# Patient Record
Sex: Male | Born: 1935 | Race: White | Hispanic: No | Marital: Married | Smoking: Never smoker
Health system: Southern US, Community
[De-identification: ages and names within clinical notes are randomized; demographics above are authoritative.]

## PROBLEM LIST (undated history)

## (undated) DIAGNOSIS — D649 Anemia, unspecified: Secondary | ICD-10-CM

## (undated) DIAGNOSIS — C61 Malignant neoplasm of prostate: Secondary | ICD-10-CM

## (undated) DIAGNOSIS — R1310 Dysphagia, unspecified: Secondary | ICD-10-CM

## (undated) DIAGNOSIS — E871 Hypo-osmolality and hyponatremia: Secondary | ICD-10-CM

## (undated) DIAGNOSIS — E785 Hyperlipidemia, unspecified: Secondary | ICD-10-CM

## (undated) DIAGNOSIS — D126 Benign neoplasm of colon, unspecified: Secondary | ICD-10-CM

## (undated) DIAGNOSIS — L84 Corns and callosities: Secondary | ICD-10-CM

## (undated) DIAGNOSIS — R413 Other amnesia: Secondary | ICD-10-CM

## (undated) DIAGNOSIS — I4891 Unspecified atrial fibrillation: Secondary | ICD-10-CM

## (undated) DIAGNOSIS — R739 Hyperglycemia, unspecified: Secondary | ICD-10-CM

## (undated) DIAGNOSIS — M1990 Unspecified osteoarthritis, unspecified site: Secondary | ICD-10-CM

## (undated) DIAGNOSIS — M1652 Unilateral post-traumatic osteoarthritis, left hip: Secondary | ICD-10-CM

## (undated) DIAGNOSIS — I825Y9 Chronic embolism and thrombosis of unspecified deep veins of unspecified proximal lower extremity: Secondary | ICD-10-CM

## (undated) DIAGNOSIS — C9001 Multiple myeloma in remission: Secondary | ICD-10-CM

## (undated) DIAGNOSIS — I10 Essential (primary) hypertension: Secondary | ICD-10-CM

## (undated) DIAGNOSIS — Z950 Presence of cardiac pacemaker: Secondary | ICD-10-CM

## (undated) DIAGNOSIS — D72825 Bandemia: Secondary | ICD-10-CM

## (undated) DIAGNOSIS — Z7901 Long term (current) use of anticoagulants: Secondary | ICD-10-CM

## (undated) HISTORY — DX: Unspecified osteoarthritis, unspecified site: M19.90

## (undated) HISTORY — PX: JOINT REPLACEMENT: SHX530

## (undated) HISTORY — DX: Essential (primary) hypertension: I10

## (undated) HISTORY — PX: PACEMAKER PLACEMENT: SHX43

## (undated) HISTORY — PX: FRACTURE SURGERY: SHX138

## (undated) HISTORY — PX: PROSTATE SURGERY: SHX751

## (undated) HISTORY — PX: HEMORRHOID SURGERY: SHX153

## (undated) HISTORY — PX: EYE SURGERY: SHX253

## (undated) HISTORY — DX: Anemia, unspecified: D64.9

---

## 2013-10-19 LAB — CBC AND DIFFERENTIAL
HCT: 28 % — AB (ref 41–53)
Hemoglobin: 9.8 g/dL — AB (ref 13.5–17.5)
PLATELETS: 224 10*3/uL (ref 150–399)

## 2013-10-20 LAB — BASIC METABOLIC PANEL
BUN: 13 mg/dL (ref 4–21)
CREATININE: 0.5 mg/dL — AB (ref 0.6–1.3)
Glucose: 99 mg/dL
POTASSIUM: 4 mmol/L (ref 3.4–5.3)
Sodium: 126 mmol/L — AB (ref 137–147)

## 2013-10-20 LAB — CBC AND DIFFERENTIAL: WBC: 9.7 10*3/mL

## 2013-10-23 ENCOUNTER — Non-Acute Institutional Stay (SKILLED_NURSING_FACILITY): Payer: Medicare HMO | Admitting: Nurse Practitioner

## 2013-10-23 DIAGNOSIS — I82409 Acute embolism and thrombosis of unspecified deep veins of unspecified lower extremity: Secondary | ICD-10-CM

## 2013-10-23 DIAGNOSIS — G629 Polyneuropathy, unspecified: Secondary | ICD-10-CM

## 2013-10-23 DIAGNOSIS — J811 Chronic pulmonary edema: Secondary | ICD-10-CM

## 2013-10-23 DIAGNOSIS — M25559 Pain in unspecified hip: Secondary | ICD-10-CM

## 2013-10-23 DIAGNOSIS — I4891 Unspecified atrial fibrillation: Secondary | ICD-10-CM | POA: Insufficient documentation

## 2013-10-23 DIAGNOSIS — H9190 Unspecified hearing loss, unspecified ear: Secondary | ICD-10-CM

## 2013-10-23 DIAGNOSIS — M25552 Pain in left hip: Secondary | ICD-10-CM

## 2013-10-23 DIAGNOSIS — Z950 Presence of cardiac pacemaker: Secondary | ICD-10-CM

## 2013-10-23 DIAGNOSIS — R062 Wheezing: Secondary | ICD-10-CM

## 2013-10-23 DIAGNOSIS — Z7901 Long term (current) use of anticoagulants: Secondary | ICD-10-CM

## 2013-10-23 DIAGNOSIS — K219 Gastro-esophageal reflux disease without esophagitis: Secondary | ICD-10-CM

## 2013-10-23 DIAGNOSIS — K140 Glossitis: Secondary | ICD-10-CM

## 2013-10-23 DIAGNOSIS — K5900 Constipation, unspecified: Secondary | ICD-10-CM

## 2013-10-23 DIAGNOSIS — I10 Essential (primary) hypertension: Secondary | ICD-10-CM | POA: Insufficient documentation

## 2013-10-23 DIAGNOSIS — D62 Acute posthemorrhagic anemia: Secondary | ICD-10-CM

## 2013-10-23 DIAGNOSIS — G609 Hereditary and idiopathic neuropathy, unspecified: Secondary | ICD-10-CM

## 2013-10-23 DIAGNOSIS — E871 Hypo-osmolality and hyponatremia: Secondary | ICD-10-CM | POA: Insufficient documentation

## 2013-10-23 DIAGNOSIS — Z5181 Encounter for therapeutic drug level monitoring: Secondary | ICD-10-CM

## 2013-10-23 NOTE — Assessment & Plan Note (Addendum)
Pro BNP 3233 10/16/13. BNP 10/24/13 SNF [facility]. He may be benefit from a low dose of HCTZ if s/s developing CHF present

## 2013-10-23 NOTE — Assessment & Plan Note (Addendum)
Left hip hemiarthroplasty. Percocet 5/325mg  prn available to him-he desires Tylenol prn.

## 2013-10-23 NOTE — Assessment & Plan Note (Addendum)
Na 126 10/24/13 and 10/19/13, currently taking Na tab for total 5 days in SNF. Hx of serum Na in 120s. Has been to endocrinology for [facility] waiting.

## 2013-10-23 NOTE — Progress Notes (Signed)
Patient ID: Derrick Levy, male   DOB: Aug 30, 1925, 80 y.o.   MRN: [id number]   Code Status: Full Code.   Allergies  Allergen Reactions  . Codeine   . Hctz [Hydrochlorothiazide]   . Lipitor [Atorvastatin]   . Quinine Derivatives   . Sulfa Antibiotics     Chief Complaint  Patient presents with  . Medical Management of Chronic Issues  . Acute Visit    s/p left hip hemiarthroplasty.     HPI: Patient is a 80 y.o. male seen in the SNF at [facility] today for evaluation of  S/p left hip hemiarthroplasty and other chronic medical conditions.   10/14/13-10/20/13 at [facility] hospital: underwent left hemiarthroplasty 10/15/13 by Dr. Benny Lennert for a displaced femoral neck fracture sustained from a fall. Post Op was complicated with pulmonary edema. He was treated with Furosemide which was discontinued later. His chronic hyponatremia(baseline sodium 130)was compromised while taking Furosemide which serum Na was as low as 121. Salt tab started and his serum Na was 126 upon discharge.  Problem List Items Addressed This Visit   A-fib   Relevant Medications      metoprolol tartrate (LOPRESSOR) 25 MG tablet      simvastatin (ZOCOR) 10 MG tablet   Anticoagulation goal of INR 2 to 3     Chronic A-Fib. S/p DVT x2    Cardiac pacemaker in situ     permanent pacemaker.     DVT (deep venous thrombosis)     S/p x2.     Relevant Medications      metoprolol tartrate (LOPRESSOR) 25 MG tablet      simvastatin (ZOCOR) 10 MG tablet   GERD (gastroesophageal reflux disease)   Relevant Medications      senna-docusate (SENOKOT-S) 8.6-50 MG per tablet   Glossitis     C/o sore and sensitive tongue-inflamed appearance of tongue-will try Magic Mouth Wash 64ml s/s ac and hs for 2 weeks.     HOH (hard of hearing)   HTN (hypertension)     Controlled. Continue Metoprolol     Relevant Medications      metoprolol tartrate (LOPRESSOR) 25 MG tablet      simvastatin (ZOCOR) 10 MG tablet   Hyponatremia - Primary       Na 126 10/24/13 and 10/19/13, currently taking Na tab for total 5 days in SNF. Hx of serum Na in 120s. Has been to endocrinology for [facility] waiting.     Left hip pain     Left hip hemiarthroplasty. Percocet 5/325mg  prn available to him-he desires Tylenol prn.     Peripheral neuropathy     Chronic.     Postoperative anemia due to acute blood loss     Hgb 9.2 10/24/13 and was 9.8 10/19/13 upon dc from hospital. Continue Fe. Observe.     Relevant Medications      ferrous sulfate 325 (65 FE) MG EC tablet   Pulmonary edema     Pro BNP 3233 10/16/13. BNP 10/24/13 SNF [facility]. He may be benefit from a low dose of HCTZ if s/s developing CHF present    Unspecified constipation     Stable. Continue Senna and fiber    Wheezes     Mild expiratory wheezes, no c/o SOB or O2 desaturation. Cardiac vs pulmonary process? Will have DuoNeb available to him. Encourage incentive spirometer use. Observe.        Review of Systems:  Review of Systems  Constitutional: Negative for fever, chills, weight  loss, malaise/fatigue and diaphoresis.       Generalized  HENT: Positive for hearing loss. Negative for congestion, ear discharge, ear pain, nosebleeds, sore throat and tinnitus.        Mild  Eyes: Negative for blurred vision, double vision, photophobia, pain, discharge and redness.  Respiratory: Positive for cough and wheezing. Negative for hemoptysis, sputum production, shortness of breath and stridor.        Occasional with deep breath.   Cardiovascular: Negative for chest pain, palpitations, orthopnea, claudication, leg swelling and PND.  Gastrointestinal: Negative for heartburn, nausea, vomiting, abdominal pain, diarrhea, constipation, blood in stool and melena.  Genitourinary: Negative for dysuria, urgency, frequency, hematuria and flank pain.  Musculoskeletal: Positive for joint pain. Negative for back pain, falls, myalgias and neck pain.       Left hip pain.   Skin: Negative for itching and  rash.       Left hip surgical incision is intact. No peri-wound cellulitis.   Neurological: Positive for weakness. Negative for dizziness, tingling, tremors, sensory change, speech change, focal weakness, seizures, loss of consciousness and headaches.  Endo/Heme/Allergies: Negative for environmental allergies and polydipsia. Does not bruise/bleed easily.  Psychiatric/Behavioral: Negative for depression, suicidal ideas, hallucinations, memory loss and substance abuse. The patient is not nervous/anxious and does not have insomnia.      Past Medical History  Diagnosis Date  . Anemia   . Arthritis   . CHF (congestive heart failure)   . Hypertension    Past Surgical History  Procedure Laterality Date  . Fracture surgery      left hemiarthroplasty 10/15/13 Dr. Alfredia Client  . Prostate surgery    . Joint replacement      right knee replacement by Dr. Onnie Graham  . Eye surgery      cataract surgery  . Hemorrhoid surgery    . Pacemaker placement     Social History:   reports that he has never smoked. He does not have any smokeless tobacco history on file. He reports that he does not drink alcohol. His drug history is not on file.  Family History  Problem Relation Age of Onset  . Hypertension    . Bladder Cancer    . Prostate cancer      Medications: Patient's Medications  New Prescriptions   No medications on file  Previous Medications   ACETAMINOPHEN (TYLENOL) 325 MG TABLET    Take 650 mg by mouth every 6 (six) hours as needed.   FERROUS SULFATE 325 (65 FE) MG EC TABLET    Take 325 mg by mouth daily with breakfast.   METOPROLOL TARTRATE (LOPRESSOR) 25 MG TABLET    Take 12.5 mg by mouth 2 (two) times daily.   MULTIPLE VITAMIN PO    Take 1 tablet by mouth daily.   OYSTER SHELL 500 MG TABS    Take by mouth daily.   PSYLLIUM HUSK POWD    3.4 g by Does not apply route daily.   SENNA-DOCUSATE (SENOKOT-S) 8.6-50 MG PER TABLET    Take 1 tablet by mouth 2 (two) times daily.   SIMVASTATIN  (ZOCOR) 10 MG TABLET    Take 10 mg by mouth daily.  Modified Medications   No medications on file  Discontinued Medications   OXYCODONE-ACETAMINOPHEN (PERCOCET/ROXICET) 5-325 MG PER TABLET    Take by mouth every 8 (eight) hours as needed for severe pain.     Physical Exam: Physical Exam  Constitutional: He is oriented to person, place, and time.  He appears well-developed and well-nourished. No distress.  HENT:  Head: Normocephalic and atraumatic.  Right Ear: External ear normal.  Left Ear: External ear normal.  Nose: Nose normal.  Mouth/Throat: Oropharynx is clear and moist. No oropharyngeal exudate.  Inflamed appearance tongue.   Eyes: Conjunctivae and EOM are normal. Pupils are equal, round, and reactive to light. Right eye exhibits no discharge. Left eye exhibits no discharge. No scleral icterus.  Neck: Normal range of motion. Neck supple. No JVD present. No tracheal deviation present. No thyromegaly present.  Cardiovascular: Normal rate and regular rhythm.   Murmur heard. Pulmonary/Chest: Effort normal. No stridor. No respiratory distress. He has wheezes. He has no rales. He exhibits no tenderness.  Mild expiratory wheezes  Abdominal: He exhibits no distension. There is no tenderness. There is no rebound and no guarding.  Musculoskeletal: He exhibits tenderness. He exhibits no edema.  Left hip pain with ROM  Lymphadenopathy:    He has no cervical adenopathy.  Neurological: He is alert and oriented to person, place, and time. He has normal reflexes. He displays normal reflexes. No cranial nerve deficit. He exhibits normal muscle tone. Coordination normal.  Skin: Skin is warm and dry. No rash noted. He is not diaphoretic. No erythema. No pallor.  Left hip surgical incision intact with staple closure. No per-wound cellulitis except mild inflammation.   Psychiatric: He has a normal mood and affect. His behavior is normal. Judgment and thought content normal.    Filed Vitals:    10/23/13 1859  BP: 130/70  Pulse: 66  Temp: 97.6 F (36.4 C)  TempSrc: Tympanic  Resp: 18      Labs reviewed: Basic Metabolic Panel:  Recent Labs  10/20/13 10/24/13  NA 126* 125*  K 4.0 3.8  BUN 13 9  CREATININE 0.5* 0.5*   Liver Function Tests:  Recent Labs  10/24/13  AST 26  ALT 62*  ALKPHOS 72   CBC:  Recent Labs  10/19/13 10/20/13 10/24/13  WBC  --  9.7 6.4  HGB 9.8*  --  9.2*  HCT 28*  --  27*  PLT 224  --  327   Past Procedures:  09/2013 [facility], X-ray left hip dislocated left femoral fracture.            Boone scan: revealed microfracture several weeks ago.   10/14/13 CXR enlargement of cardiac silhouette with pulmonary vascular. Slight pulmonary hyperinflation without acute abnormalities.    Assessment/Plan Hyponatremia Na 126 10/24/13 and 10/19/13, currently taking Na tab for total 5 days in SNF. Hx of serum Na in 120s. Has been to endocrinology for [facility] waiting.   Pulmonary edema Pro BNP 3233 10/16/13. BNP 10/24/13 SNF [facility]. He may be benefit from a low dose of HCTZ if s/s developing CHF present  Left hip pain Left hip hemiarthroplasty. Percocet 5/325mg  prn available to him-he desires Tylenol prn.   HTN (hypertension) Controlled. Continue Metoprolol   Unspecified constipation Stable. Continue Senna and fiber  Postoperative anemia due to acute blood loss Hgb 9.2 10/24/13 and was 9.8 10/19/13 upon dc from hospital. Continue Fe. Observe.   Glossitis C/o sore and sensitive tongue-inflamed appearance of tongue-will try Magic Mouth Wash 29ml s/s ac and hs for 2 weeks.   Wheezes Mild expiratory wheezes, no c/o SOB or O2 desaturation. Cardiac vs pulmonary process? Will have DuoNeb available to him. Encourage incentive spirometer use. Observe.   Cardiac pacemaker in situ permanent pacemaker.   DVT (deep venous thrombosis) S/p x2.  Peripheral neuropathy Chronic.   Anticoagulation goal of INR 2 to 3 Chronic A-Fib. S/p DVT  x2    Family/ Staff Communication: observe the patient  Goals of Care: SNF  Labs/tests ordered: CBC, CMP, BNP done 10/24/13

## 2013-10-24 LAB — CBC AND DIFFERENTIAL
HEMATOCRIT: 27 % — AB (ref 41–53)
HEMOGLOBIN: 9.2 g/dL — AB (ref 13.5–17.5)
PLATELETS: 327 10*3/uL (ref 150–399)
WBC: 6.4 10^3/mL

## 2013-10-24 LAB — HEPATIC FUNCTION PANEL
ALT: 62 U/L — AB (ref 10–40)
AST: 26 U/L (ref 14–40)
Alkaline Phosphatase: 72 U/L (ref 25–125)
BILIRUBIN, TOTAL: 0.8 mg/dL

## 2013-10-24 LAB — BASIC METABOLIC PANEL
BUN: 9 mg/dL (ref 4–21)
Creatinine: 0.5 mg/dL — AB (ref 0.6–1.3)
Glucose: 82 mg/dL
Potassium: 3.8 mmol/L (ref 3.4–5.3)
Sodium: 125 mmol/L — AB (ref 137–147)

## 2013-10-25 DIAGNOSIS — D62 Acute posthemorrhagic anemia: Secondary | ICD-10-CM | POA: Insufficient documentation

## 2013-10-25 DIAGNOSIS — R062 Wheezing: Secondary | ICD-10-CM | POA: Insufficient documentation

## 2013-10-25 DIAGNOSIS — K140 Glossitis: Secondary | ICD-10-CM | POA: Insufficient documentation

## 2013-10-25 NOTE — Assessment & Plan Note (Addendum)
Stable. Continue Senna and fiber

## 2013-10-25 NOTE — Assessment & Plan Note (Signed)
Hgb 9.2 10/24/13 and was 9.8 10/19/13 upon dc from hospital. Continue Fe. Observe.   

## 2013-10-25 NOTE — Assessment & Plan Note (Signed)
Mild expiratory wheezes, no c/o SOB or O2 desaturation. Cardiac vs pulmonary process? Will have DuoNeb available to him. Encourage incentive spirometer use. Observe.

## 2013-10-25 NOTE — Assessment & Plan Note (Addendum)
Controlled. Continue Metoprolol

## 2013-10-25 NOTE — Assessment & Plan Note (Signed)
C/o sore and sensitive tongue-inflamed appearance of tongue-will try Magic Mouth Wash 60ml s/s ac and hs for 2 weeks.

## 2013-10-26 ENCOUNTER — Encounter: Payer: Self-pay | Admitting: Nurse Practitioner

## 2013-10-26 DIAGNOSIS — Z86718 Personal history of other venous thrombosis and embolism: Secondary | ICD-10-CM | POA: Insufficient documentation

## 2013-10-26 DIAGNOSIS — G629 Polyneuropathy, unspecified: Secondary | ICD-10-CM | POA: Insufficient documentation

## 2013-10-26 DIAGNOSIS — Z7901 Long term (current) use of anticoagulants: Secondary | ICD-10-CM | POA: Insufficient documentation

## 2013-10-26 DIAGNOSIS — Z950 Presence of cardiac pacemaker: Secondary | ICD-10-CM | POA: Insufficient documentation

## 2013-10-26 NOTE — Assessment & Plan Note (Signed)
Chronic. 

## 2013-10-26 NOTE — Assessment & Plan Note (Signed)
S/p x2.

## 2013-10-26 NOTE — Assessment & Plan Note (Signed)
Chronic A-Fib. S/p DVT x2

## 2013-10-26 NOTE — Assessment & Plan Note (Signed)
permanent pacemaker.

## 2013-10-30 ENCOUNTER — Encounter: Payer: Self-pay | Admitting: Nurse Practitioner

## 2013-11-06 ENCOUNTER — Encounter: Payer: Self-pay | Admitting: Nurse Practitioner

## 2013-11-06 DIAGNOSIS — D4989 Neoplasm of unspecified behavior of other specified sites: Secondary | ICD-10-CM | POA: Insufficient documentation

## 2013-11-20 ENCOUNTER — Non-Acute Institutional Stay (SKILLED_NURSING_FACILITY): Payer: Medicare HMO | Admitting: Nurse Practitioner

## 2013-11-20 ENCOUNTER — Other Ambulatory Visit: Payer: Self-pay | Admitting: Nurse Practitioner

## 2013-11-20 ENCOUNTER — Encounter: Payer: Self-pay | Admitting: Nurse Practitioner

## 2013-11-20 DIAGNOSIS — K5900 Constipation, unspecified: Secondary | ICD-10-CM

## 2013-11-20 DIAGNOSIS — M25552 Pain in left hip: Secondary | ICD-10-CM

## 2013-11-20 DIAGNOSIS — C9000 Multiple myeloma not having achieved remission: Secondary | ICD-10-CM

## 2013-11-20 DIAGNOSIS — I10 Essential (primary) hypertension: Secondary | ICD-10-CM

## 2013-11-20 DIAGNOSIS — E871 Hypo-osmolality and hyponatremia: Secondary | ICD-10-CM

## 2013-11-20 DIAGNOSIS — D4989 Neoplasm of unspecified behavior of other specified sites: Secondary | ICD-10-CM

## 2013-11-20 DIAGNOSIS — I4891 Unspecified atrial fibrillation: Secondary | ICD-10-CM

## 2013-11-20 DIAGNOSIS — D62 Acute posthemorrhagic anemia: Secondary | ICD-10-CM

## 2013-11-20 DIAGNOSIS — M25559 Pain in unspecified hip: Secondary | ICD-10-CM

## 2013-11-20 MED ORDER — DEXAMETHASONE 2 MG PO TABS: 20.0000 mg | ORAL_TABLET | ORAL | Status: DC

## 2013-11-20 NOTE — Assessment & Plan Note (Signed)
Controlled. Continue Metoprolol 12.5mg  qd and Lisinopril 2.5mg  daily.

## 2013-11-20 NOTE — Assessment & Plan Note (Signed)
Na 126 10/24/13 and 10/19/13.  Hx of serum Na in 120s. Has been to endocrinology for [facility] waiting.

## 2013-11-20 NOTE — Assessment & Plan Note (Addendum)
No BM x 2 days. Continue Senna and increase Metamucil to bid.

## 2013-11-20 NOTE — Assessment & Plan Note (Signed)
Hgb 9.2 10/24/13 and was 9.8 10/19/13 upon dc from hospital. Continue Fe. Observe.

## 2013-11-20 NOTE — Assessment & Plan Note (Signed)
Chronic A-Fib. S/p DVT x2. Monitor PT/INR frequently since his chemo tx starts tomorrow 11/21/13

## 2013-11-20 NOTE — Progress Notes (Signed)
Patient ID: Derrick Levy, male   DOB: 1926/02/05, 80 y.o.   MRN: [id number]   Code Status: Full Code.   Allergies  Allergen Reactions  . Codeine   . Hctz [Hydrochlorothiazide]   . Lipitor [Atorvastatin]   . Quinine Derivatives   . Sulfa Antibiotics     Chief Complaint  Patient presents with  . Medical Management of Chronic Issues    HPI: Patient is a 80 y.o. male seen in the SNF at [facility] today for evaluation of  Multiple myeloma, S/p left hip hemiarthroplasty and other chronic medical conditions.   10/14/13-10/20/13 at [facility] hospital: underwent left hemiarthroplasty 10/15/13 by Dr. Benny Lennert for a displaced femoral neck fracture sustained from a fall. Post Op was complicated with pulmonary edema. He was treated with Furosemide which was discontinued later. His chronic hyponatremia(baseline sodium 130)was compromised while taking Furosemide which serum Na was as low as 121. Salt tab started and his serum Na was 126 upon discharge.  Problem List Items Addressed This Visit   A-fib - Primary     Chronic A-Fib. S/p DVT x2. Monitor PT/INR frequently since his chemo tx starts tomorrow 11/21/13     HTN (hypertension)     Controlled. Continue Metoprolol 12.25m qd and Lisinopril 2.531mdaily.      Hyponatremia     Na 126 10/24/13 and 10/19/13.  Hx of serum Na in 120s. [facility]      Left [facility] pain     Left hip hemiarthroplasty. Percocet 5/32528mrn available to him-he desires Tylenol prn    Plasma cell neoplasm     Will start chemo tx 11/21/13 by Oncology. Observe the patient.     Postoperative anemia due to acute blood loss     Hgb 9.2 10/24/13 and was 9.8 10/19/13 upon dc from hospital. Continue Fe. Observe.      Unspecified constipation     No BM x 2 days. Continue Senna and increase Metamucil to bid.         Review of Systems:  Review of Systems  Constitutional: Negative for fever, chills, weight loss, malaise/fatigue and  diaphoresis.       Generalized  HENT: Positive for hearing loss. Negative for congestion, ear discharge, ear pain, nosebleeds, sore throat and tinnitus.        Mild  Eyes: Negative for blurred vision, double vision, photophobia, pain, discharge and redness.  Respiratory: Positive for cough and wheezing. Negative for hemoptysis, sputum production, shortness of breath and stridor.        Occasional with deep breath.   Cardiovascular: Negative for chest pain, palpitations, orthopnea, claudication, leg swelling and PND.  Gastrointestinal: Positive for constipation. Negative for heartburn, nausea, vomiting, abdominal pain, diarrhea, blood in stool and melena.       No BM x 2 days.  Genitourinary: Negative for dysuria, urgency, frequency, hematuria and flank pain.  Musculoskeletal: Positive for joint pain. Negative for back pain, falls, myalgias and neck pain.       Left hip pain-improved  Skin: Negative for itching and rash.       Left hip surgical incision is healed.    Neurological: Positive for weakness. Negative for dizziness, tingling, tremors, sensory change, speech change, focal weakness, seizures, loss of consciousness and headaches.  Endo/Heme/Allergies: Negative for environmental allergies and polydipsia. Does not bruise/bleed easily.  Psychiatric/Behavioral: Negative for depression, suicidal ideas, hallucinations, memory loss and substance abuse. The patient is not nervous/anxious and does not have  insomnia.      Past Medical History  Diagnosis Date  . Anemia   . Arthritis   . CHF (congestive heart failure)   . Hypertension    Past Surgical History  Procedure Laterality Date  . Fracture surgery      left hemiarthroplasty 10/15/13 Dr. Alfredia Client  . Prostate surgery    . Joint replacement      right knee replacement by Dr. Onnie Graham  . Eye surgery      cataract surgery  . Hemorrhoid surgery    . Pacemaker placement     Social History:   reports that he has never smoked.  He does not have any smokeless tobacco history on file. He reports that he does not drink alcohol. His drug history is not on file.  Family History  Problem Relation Age of Onset  . Hypertension    . Bladder Cancer    . Prostate cancer      Medications: Patient's Medications  New Prescriptions   No medications on file  Previous Medications   ACETAMINOPHEN (TYLENOL) 325 MG TABLET    Take 650 mg by mouth every 6 (six) hours as needed.   DEXAMETHASONE (DECADRON) 2 MG TABLET    Take 10 tablets (20 mg total) by mouth once a week.   FERROUS SULFATE 325 (65 FE) MG EC TABLET    Take 325 mg by mouth daily with breakfast.   METOPROLOL TARTRATE (LOPRESSOR) 25 MG TABLET    Take 12.5 mg by mouth 2 (two) times daily.   MULTIPLE VITAMIN PO    Take 1 tablet by mouth daily.   OYSTER SHELL 500 MG TABS    Take by mouth daily.   PSYLLIUM HUSK POWD    3.4 g by Does not apply route daily.   SENNA-DOCUSATE (SENOKOT-S) 8.6-50 MG PER TABLET    Take 1 tablet by mouth 2 (two) times daily.   SIMVASTATIN (ZOCOR) 10 MG TABLET    Take 10 mg by mouth daily.  Modified Medications   No medications on file  Discontinued Medications   No medications on file     Physical Exam: Physical Exam  Constitutional: He is oriented to person, place, and time. He appears well-developed and well-nourished. No distress.  HENT:  Head: Normocephalic and atraumatic.  Right Ear: External ear normal.  Left Ear: External ear normal.  Nose: Nose normal.  Mouth/Throat: Oropharynx is clear and moist. No oropharyngeal exudate.  Inflamed appearance tongue-resolved.   Eyes: Conjunctivae and EOM are normal. Pupils are equal, round, and reactive to light. Right eye exhibits no discharge. Left eye exhibits no discharge. No scleral icterus.  Neck: Normal range of motion. Neck supple. No JVD present. No tracheal deviation present. No thyromegaly present.  Cardiovascular: Normal rate and regular rhythm.   Murmur heard. Pulmonary/Chest:  Effort normal. No stridor. No respiratory distress. He has no wheezes. He has no rales. He exhibits no tenderness.  Abdominal: He exhibits no distension. There is no tenderness. There is no rebound and no guarding.  Musculoskeletal: He exhibits tenderness. He exhibits no edema.  Left hip pain with ROM-much improved.   Lymphadenopathy:    He has no cervical adenopathy.  Neurological: He is alert and oriented to person, place, and time. He has normal reflexes. No cranial nerve deficit. He exhibits normal muscle tone. Coordination normal.  Skin: Skin is warm and dry. No rash noted. He is not diaphoretic. No erythema. No pallor.  Left hip surgical incision healed.  Psychiatric: He has a normal mood and affect. His behavior is normal. Judgment and thought content normal.    Filed Vitals:   11/20/13 1558  BP: 120/70  Pulse: 68  Temp: 97.7 F (36.5 C)  TempSrc: Tympanic  Resp: 16      Labs reviewed: Basic Metabolic Panel:  Recent Labs  10/20/13 10/24/13  NA 126* 125*  K 4.0 3.8  BUN 13 9  CREATININE 0.5* 0.5*   Liver Function Tests:  Recent Labs  10/24/13  AST 26  ALT 62*  ALKPHOS 72   CBC:  Recent Labs  10/19/13 10/20/13 10/24/13  WBC  --  9.7 6.4  HGB 9.8*  --  9.2*  HCT 28*  --  27*  PLT 224  --  327   Past Procedures:  09/2013 [facility], X-ray left hip dislocated left femoral fracture.            [facility]one scan: revealed microfracture several weeks ago.   10/14/13 CXR enlargement of cardiac silhouette with pulmonary vascular. Slight pulmonary hyperinflation without acute abnormalities.    Assessment/Plan Plasma cell neoplasm Will start chemo tx 11/21/13 by Oncology. Observe the patient.   HTN (hypertension) Controlled. Continue Metoprolol 12.65m qd and Lisinopril 2.568mdaily.    Unspecified constipation No BM x 2 days. Continue Senna and increase Metamucil to bid.    Postoperative anemia due to acute blood loss Hgb 9.2 10/24/13 and was 9.8 10/19/13 upon dc  from hospital. Continue Fe. Observe.    A-fib Chronic A-Fib. S/p DVT x2. Monitor PT/INR frequently since his chemo tx starts tomorrow 11/21/13   Hyponatremia Na 126 10/24/13 and 10/19/13.  Hx of serum Na in 120s. [facility]    Left hip pain Left hip hemiarthroplasty. Percocet 5/32537mrn available to him-he desires Tylenol prn    Family/ Staff Communication: observe the patient  Goals of Care: SNF  Labs/tests ordered: none

## 2013-11-20 NOTE — Assessment & Plan Note (Signed)
Left hip hemiarthroplasty. Percocet 5/325mg prn available to him-he desires Tylenol prn.  

## 2013-11-20 NOTE — Assessment & Plan Note (Signed)
Will start chemo tx 11/21/13 by Oncology. Observe the patient.

## 2017-03-09 LAB — HEPATIC FUNCTION PANEL
ALT: 29 (ref 10–40)
AST: 21 (ref 14–40)
Alkaline Phosphatase: 79 (ref 25–125)
BILIRUBIN, TOTAL: 1.1

## 2017-03-13 LAB — CBC AND DIFFERENTIAL
HEMATOCRIT: 41 (ref 41–53)
HEMOGLOBIN: 13.9 (ref 13.5–17.5)
Platelets: 158 (ref 150–399)
WBC: 9.9

## 2017-03-14 LAB — BASIC METABOLIC PANEL
BUN: 36 — AB (ref 4–21)
Creatinine: 0.9 (ref 0.6–1.3)
Potassium: 4.1 (ref 3.4–5.3)
Sodium: 142 (ref 137–147)

## 2017-03-15 LAB — POCT INR: INR: 2.9 — AB (ref 0.9–1.1)

## 2017-03-16 ENCOUNTER — Non-Acute Institutional Stay (SKILLED_NURSING_FACILITY): Payer: Medicare Other | Admitting: Internal Medicine

## 2017-03-16 ENCOUNTER — Encounter: Payer: Self-pay | Admitting: Internal Medicine

## 2017-03-16 DIAGNOSIS — I10 Essential (primary) hypertension: Secondary | ICD-10-CM

## 2017-03-16 DIAGNOSIS — K5909 Other constipation: Secondary | ICD-10-CM

## 2017-03-16 DIAGNOSIS — E785 Hyperlipidemia, unspecified: Secondary | ICD-10-CM

## 2017-03-16 DIAGNOSIS — J810 Acute pulmonary edema: Secondary | ICD-10-CM | POA: Diagnosis not present

## 2017-03-16 DIAGNOSIS — D4989 Neoplasm of unspecified behavior of other specified sites: Secondary | ICD-10-CM

## 2017-03-16 DIAGNOSIS — R2681 Unsteadiness on feet: Secondary | ICD-10-CM

## 2017-03-16 DIAGNOSIS — R531 Weakness: Secondary | ICD-10-CM | POA: Diagnosis not present

## 2017-03-16 DIAGNOSIS — E611 Iron deficiency: Secondary | ICD-10-CM

## 2017-03-16 DIAGNOSIS — I480 Paroxysmal atrial fibrillation: Secondary | ICD-10-CM

## 2017-03-16 NOTE — Progress Notes (Signed)
Provider:  Blanchie Serve MD  Location:  [facility] Room Number: 46 Place of Service:  SNF (31)  PCP: No primary care provider on file. No care team member to display  Extended Emergency Contact Information Primary Emergency Contact: [facility] Address: [address] [place]          [place], [place] [address] [place] Phone: [phone] Relation: Spouse Secondary Emergency Contact: Gibson,Sarah Address: [address]          [place], [place] [address] Johnnette Litter of [place] Phone: [phone] Relation: Other  Code Status: Full Code Goals of Care: Advanced Directive information No flowsheet data found.    Chief Complaint  Patient presents with  . New Admit To SNF    New Admission Visit     HPI: Patient is a 83 y.o. male seen today for admission visit from hospital. He was in the hospital from 03/09/17-03/15/17 with acute respiratory failure. There was concern for pulmonary edema vs multifocal pneumonia. He was started on iv diuresis with clinical finding suggestive towards volume overload and his breathing improved. He had an echocardiogram during his last hospital admission showing normal LVEF. He made clinical improvement and did not require antibiotics. He has medical history of multiple myeloma, afib, HTN, peripheral neuropathy and dementia among others. He is seen in his room with his wife at bedside.   Past Medical History:  Diagnosis Date  . Anemia   . Arthritis   . CHF (congestive heart failure) ([facility])   . Hypertension    Past Surgical History:  Procedure Laterality Date  . EYE SURGERY     cataract surgery  . FRACTURE SURGERY     left hemiarthroplasty 10/15/13 Dr. Alfredia Client  . HEMORRHOID SURGERY    . JOINT REPLACEMENT     right knee replacement by Dr. Onnie Graham  . PACEMAKER PLACEMENT    . PROSTATE SURGERY      reports that he has never smoked. He does not have any smokeless tobacco history on file. He  reports that he does not drink alcohol. His drug history is not on file. Social History   Social History  . Marital status: Married    Spouse name: N/A  . Number of children: N/A  . Years of education: N/A   Occupational History  . Not on file.   Social History Main Topics  . Smoking status: Never Smoker  . Smokeless tobacco: Not on file  . Alcohol use No  . Drug use: Unknown  . Sexual activity: Not on file   Other Topics Concern  . Not on file   Social History Narrative  . No narrative on file    Functional Status Survey:    Family History  Problem Relation Age of Onset  . Hypertension Unknown   . Bladder Cancer Unknown   . Prostate cancer Unknown     Health Maintenance  Topic Date Due  . TETANUS/TDAP  05/25/1947  . PNA vac Low Risk Adult (1 of 2 - PCV13) 05/24/1993  . INFLUENZA VACCINE  01/20/2017    Allergies  Allergen Reactions  . Codeine   . Hctz [Hydrochlorothiazide]   . Lipitor [Atorvastatin]   . Quinine Derivatives   . Sulfa Antibiotics     Outpatient Encounter Prescriptions as of 03/16/2017  Medication Sig  . acetaminophen (TYLENOL) 325 MG tablet Take 650 mg by mouth at bedtime as needed.   Marland Kitchen acyclovir (ZOVIRAX) 400 MG tablet Take  400 mg by mouth daily.  Marland Kitchen amLODipine (NORVASC) 5 MG tablet Take 5 mg by mouth daily.  . Calcium 500 MG CHEW Chew 1 tablet by mouth daily.  . carboxymethylcellulose (REFRESH PLUS) 0.5 % SOLN Place 1 drop into both eyes as needed.  Marland Kitchen dexamethasone (DECADRON) 2 MG tablet Take 10 tablets (20 mg total) by mouth once a week.  . ferrous sulfate 325 (65 FE) MG EC tablet Take 325 mg by mouth daily with breakfast.  . furosemide (LASIX) 40 MG tablet Take 40 mg by mouth daily.  Marland Kitchen lisinopril (PRINIVIL,ZESTRIL) 2.5 MG tablet Take 2.5 mg by mouth daily.  . metoprolol tartrate (LOPRESSOR) 25 MG tablet Take 12.5 mg by mouth daily.   . MULTIPLE VITAMIN PO Take 1 tablet by mouth daily.  Marland Kitchen senna-docusate (SENOKOT-S) 8.6-50 MG per tablet  Take 1 tablet by mouth daily as needed.   . simvastatin (ZOCOR) 10 MG tablet Take 10 mg by mouth daily.  Marland Kitchen warfarin (COUMADIN) 2.5 MG tablet Take 2.5 mg by mouth daily.  . [DISCONTINUED] Oyster Shell 500 MG TABS Take by mouth daily.  . [DISCONTINUED] Psyllium Husk POWD 3.4 g by Does not apply route daily.   No facility-administered encounter medications on file as of 03/16/2017.     Review of Systems  Constitutional: Positive for fatigue. Negative for appetite change, chills and fever.       Energy level is slowly coming back.   HENT: Positive for rhinorrhea. Negative for congestion, mouth sores, sore throat and trouble swallowing.   Eyes:       Wears glasses.   Respiratory: Negative for cough, shortness of breath and wheezing.        Breathing has improved per patients wife.  Cardiovascular: Negative for chest pain, palpitations and leg swelling.  Gastrointestinal: Negative for abdominal pain, constipation, diarrhea, nausea and vomiting.       Appetite has improved. Patient has a bowel movement every day at home per patients wife.  Genitourinary: Negative for dysuria, flank pain and hematuria.  Musculoskeletal: Positive for gait problem. Negative for arthralgias and back pain.  Skin: Negative for rash and wound.  Neurological: Positive for weakness. Negative for dizziness, light-headedness and headaches.  Psychiatric/Behavioral: Positive for confusion. Negative for behavioral problems.    Vitals:   03/16/17 1430  BP: 118/68  Pulse: 68  Resp: 18  Temp: 98.9 F (37.2 C)  TempSrc: Oral  SpO2: 96%  Weight: 127 lb 4.8 oz (57.7 kg)   There is no height or weight on file to calculate BMI. Physical Exam  Constitutional:  Frail elderly male in no acute distress  HENT:  Head: Normocephalic and atraumatic.  Mouth/Throat: Oropharynx is clear and moist.  Eyes: Pupils are equal, round, and reactive to light. Conjunctivae and EOM are normal.  Has corrective glasses  Neck: Normal range  of motion. Neck supple. No thyromegaly present.  Cardiovascular: Normal rate and regular rhythm.   Pulmonary/Chest: Effort normal. No respiratory distress. He has no wheezes. He has rales.  Abdominal: Soft. Bowel sounds are normal. There is no tenderness. There is no rebound and no guarding.  Musculoskeletal: He exhibits no edema.  Can move all 4 extremities, generalized weakness  Lymphadenopathy:    He has no cervical adenopathy.  Neurological: He is alert.  Oriented to place and person only  Skin: Skin is warm and dry. No rash noted.  Psychiatric: He has a normal mood and affect.    Labs reviewed: Basic Metabolic Panel:  Recent Labs  03/14/17  NA 142  K 4.1  BUN 36*  CREATININE 0.9   Liver Function Tests:  Recent Labs  03/09/17  AST 21  ALT 29  ALKPHOS 79   No results for input(s): LIPASE, AMYLASE in the last 8760 hours. No results for input(s): AMMONIA in the last 8760 hours. CBC:  Recent Labs  03/13/17  WBC 9.9  HGB 13.9  HCT 41  PLT 158    Imaging and Procedures obtained prior to SNF admission: Xr Chest Portable 03/09/17  Impression. Partial clearing of bilateral pulmonary infiltrates/edema. Mild residual noted both lung bases. Follow chest x-rays demonstrates complete clearing suggest. Cardiac pacer with lead tip sin right atrium right ventricle. Cardiomegaly. No pulmonary venous congestion.  Xr Chest Ap Portable 03/09/17 Impression. Findings consistent with congestive heart failure. There is slightly more interstitial alveolar edema compared to most recent study. Cardiac silhouette is stable. There is aortic atherosclerosis. It should be noted that mild superimposed pneumonia in the left mid lung cannot be excluded radio graphically. Aortic atherosclerosis.   Xr Chest Ap Portable 02/25/17 Impression. Low borderline cardiomegaly with similar appearance of pulmonary vascular congestion. Stale slight increase in left lower lobe airspace disease likely reflects  atelectasis. Early infection is not excluded. Left pleural effusion.  Xr Chest Ap Portable 02/23/17 Impression. Diffuse parenchymal changes suggestive of pulmonary edema. Sequential pacemaker in place with top-normal heart size. Aortic atherosclerosis.  CT Chest Wo Contrast 03/09/17 Impression. Multifocal bilateral ground-glass densities with somewhat perihilar distribution, could be due to pulmonary edema although multifocal pneumonia could also produce this appearance. There are small bilateral effusions and lower lobe consolidations which may reflect atelectasis or consolidative pneumonia. Right adrenal gland calcification may be due to prior hemorrhage. No masslike features. Aortic atherosclerosis.    Assessment/Plan  Generalized weakness Will have patient work with PT/OT as tolerated to regain strength and restore function.  Fall precautions are in place.  Unsteady gait Fall precautions. To use his wheelchair for now and walker with therapy. Will have him work with physical therapy and occupational therapy team to help with gait training and muscle strengthening exercises.fall precautions. Skin care. Encourage to be out of bed.   Pulmonary edema S/p iv diuresis and responded well. Continue furosemide 40 mg daily, lisinopril 2.5 mg daily. Check weight for now. Check bmp. Echocardiogram 02/24/17 with normal EF   HTN Monitor BP reading. Continue amlodipine 5 mg daily, lisinopril 2.5 mg daily  afib Controlled HR, s/p pacemaker. continue metoprolol tartrate 12.5 mg daily for rate control. continue warfarin for anticoagulation. Monitor INR goal 2-3  Iron deficiency anemia Monitor h&h, continue feso4   Multiple myeloma Continue dexamethasone 20 mg weekly. F/u with dr sanders. Under chemotherapy.   Hyperlipidemia Continue his statin, no changes made  Chronic constipation Continue senokot s as needed, maintain hydration   Family/ staff Communication: reviewed care plan with patient,  his wife and charge nurse.    Labs/tests ordered: cbc, bmp in 1 week  Blanchie Serve, MD Internal Medicine [facility] [address] [place], [place] [address] Cell Phone (Monday-Friday 8 am - 5 pm): [phone] On Call: [phone] and follow prompts after 5 pm and on weekends Office Phone: [phone] Office Fax: [phone]

## 2017-03-18 ENCOUNTER — Encounter: Payer: Self-pay | Admitting: Internal Medicine

## 2017-03-18 ENCOUNTER — Non-Acute Institutional Stay (SKILLED_NURSING_FACILITY): Payer: Medicare Other | Admitting: Internal Medicine

## 2017-03-18 DIAGNOSIS — R410 Disorientation, unspecified: Secondary | ICD-10-CM

## 2017-03-18 DIAGNOSIS — R05 Cough: Secondary | ICD-10-CM

## 2017-03-18 DIAGNOSIS — R059 Cough, unspecified: Secondary | ICD-10-CM

## 2017-03-18 DIAGNOSIS — R509 Fever, unspecified: Secondary | ICD-10-CM

## 2017-03-18 DIAGNOSIS — I959 Hypotension, unspecified: Secondary | ICD-10-CM | POA: Diagnosis not present

## 2017-03-18 LAB — BASIC METABOLIC PANEL
BUN: 33 — AB (ref 4–21)
CREATININE: 0.8 (ref 0.6–1.3)
GLUCOSE: 155
Potassium: 3.8 (ref 3.4–5.3)
SODIUM: 134 — AB (ref 137–147)

## 2017-03-18 LAB — CBC AND DIFFERENTIAL
HCT: 39 — AB (ref 41–53)
Hemoglobin: 13 — AB (ref 13.5–17.5)
Platelets: 172 (ref 150–399)
WBC: 11.4

## 2017-03-18 LAB — HEPATIC FUNCTION PANEL
ALK PHOS: 62 (ref 25–125)
ALT: 20 (ref 10–40)
AST: 16 (ref 14–40)
Bilirubin, Total: 1.2

## 2017-03-18 NOTE — Progress Notes (Signed)
Provider:  Blanchie Serve MD  Location:  [facility] Room Number: 21 Place of Service:  SNF (31)  PCP: No primary care provider on file. No care team member to display  Extended Emergency Contact Information Primary Emergency Contact: [facility] Address: [address] [place]          [place], [place] [address] [place] Phone: [phone] Relation: Spouse Secondary Emergency Contact: Gibson,Sarah Address: [place]          [place], [place] [address] Johnnette Litter of [place] Phone: [phone] Relation: Other  Code Status: Full Code Goals of Care: Advanced Directive information No flowsheet data found.    Chief Complaint  Patient presents with  . Acute Visit    Fever    HPI: Patient is a 83 y.o. male seen today for acute visit.  Fever- nursing reported temp of 100.8 today. Pt seen in room. He has dementia and this limits his HPI and ROS some. He is here for rehab post recent hospital admission for acute respiratory failure with pulmonary edema. He denies any trouble with breathing. Denies chest pain or abdominal pain. Appetite poor. Per nursing he has been confused more than admission and complaints of pain. When interviewed, pt denies pain.   supratherapeutic inr- inr today 3.8, on coumadin for afib. No bleed reported.   Past Medical History:  Diagnosis Date  . Anemia   . Arthritis   . CHF (congestive heart failure) ([facility])   . Hypertension    Past Surgical History:  Procedure Laterality Date  . EYE SURGERY     cataract surgery  . FRACTURE SURGERY     left hemiarthroplasty 10/15/13 Dr. Alfredia Client  . HEMORRHOID SURGERY    . JOINT REPLACEMENT     right knee replacement by Dr. Onnie Graham  . PACEMAKER PLACEMENT    . PROSTATE SURGERY      reports that he has never smoked. He does not have any smokeless tobacco history on file. He reports that he does not drink alcohol. His drug history is not on file. Social  History   Social History  . Marital status: Married    Spouse name: N/A  . Number of children: N/A  . Years of education: N/A   Occupational History  . Not on file.   Social History Main Topics  . Smoking status: Never Smoker  . Smokeless tobacco: Not on file  . Alcohol use No  . Drug use: Unknown  . Sexual activity: Not on file   Other Topics Concern  . Not on file   Social History Narrative  . No narrative on file    Functional Status Survey:    Family History  Problem Relation Age of Onset  . Hypertension Unknown   . Bladder Cancer Unknown   . Prostate cancer Unknown     Health Maintenance  Topic Date Due  . TETANUS/TDAP  05[birth year]1947  . PNA vac Low Risk Adult (1 of 2 - PCV13) 05/24/1993  . INFLUENZA VACCINE  01/20/2017    Allergies  Allergen Reactions  . Codeine   . Hctz [Hydrochlorothiazide]   . Lipitor [Atorvastatin]   . Quinine Derivatives   . Sulfa Antibiotics     Outpatient Encounter Prescriptions as of 03/18/2017  Medication Sig  . acetaminophen (TYLENOL) 325 MG tablet Take 650 mg by mouth at bedtime as needed.   Marland Kitchen acyclovir (ZOVIRAX) 400 MG tablet Take 400 mg by mouth daily.  Marland Kitchen  amLODipine (NORVASC) 5 MG tablet Take 5 mg by mouth daily.  . Calcium 500 MG CHEW Chew 1 tablet by mouth daily.  . carboxymethylcellulose (REFRESH PLUS) 0.5 % SOLN Place 1 drop into both eyes as needed.  Marland Kitchen dexamethasone (DECADRON) 2 MG tablet Take 10 tablets (20 mg total) by mouth once a week.  . ferrous sulfate 325 (65 FE) MG EC tablet Take 325 mg by mouth daily with breakfast.  . furosemide (LASIX) 40 MG tablet Take 40 mg by mouth daily.  Marland Kitchen lisinopril (PRINIVIL,ZESTRIL) 2.5 MG tablet Take 2.5 mg by mouth daily.  . metoprolol tartrate (LOPRESSOR) 25 MG tablet Take 12.5 mg by mouth daily.   . MULTIPLE VITAMIN PO Take 1 tablet by mouth daily.  Marland Kitchen senna-docusate (SENOKOT-S) 8.6-50 MG per tablet Take 1 tablet by mouth daily as needed.   . simvastatin (ZOCOR) 10 MG tablet  Take 10 mg by mouth daily.  . [DISCONTINUED] warfarin (COUMADIN) 2.5 MG tablet Take 2.5 mg by mouth daily.   No facility-administered encounter medications on file as of 03/18/2017.     Review of Systems  Constitutional: Positive for appetite change, fatigue and fever. Negative for chills.       Energy level is slowly coming back.   HENT: Positive for rhinorrhea. Negative for congestion, mouth sores, sore throat and trouble swallowing.   Eyes:       Wears glasses.   Respiratory: Positive for cough. Negative for chest tightness, shortness of breath and wheezing.        Breathing has improved per patients wife.  Cardiovascular: Negative for chest pain, palpitations and leg swelling.  Gastrointestinal: Negative for abdominal pain, constipation, diarrhea, nausea and vomiting.  Genitourinary: Negative for dysuria, flank pain and hematuria.  Musculoskeletal: Positive for gait problem. Negative for arthralgias and back pain.  Skin: Negative for rash and wound.  Neurological: Positive for weakness. Negative for dizziness, light-headedness and headaches.  Psychiatric/Behavioral: Positive for agitation and confusion. Negative for behavioral problems.    Vitals:   03/18/17 1622  BP: (!) 90/56  Pulse: 66  Resp: 16  Temp: 99.9 F (37.7 C)  TempSrc: Oral  SpO2: 93%  Weight: 129 lb 11.2 oz (58.8 kg)  Height: _0  (1.753 m)   Body mass index is 19.15 kg/m. Physical Exam  Constitutional:  Frail elderly male in no acute distress  HENT:  Head: Normocephalic and atraumatic.  Mouth/Throat: Oropharynx is clear and moist.  Eyes: Pupils are equal, round, and reactive to light. Conjunctivae and EOM are normal.  Has corrective glasses  Neck: Normal range of motion. Neck supple. No thyromegaly present.  Cardiovascular: Normal rate and regular rhythm.   Pulmonary/Chest: Effort normal. No respiratory distress. He has no wheezes. He has rales.  Abdominal: Soft. Bowel sounds are normal. There is no  tenderness. There is no rebound and no guarding.  Musculoskeletal: He exhibits no edema.  Can move all 4 extremities, generalized weakness  Lymphadenopathy:    He has no cervical adenopathy.  Neurological: He is alert.  Oriented to self only  Skin: Skin is warm and dry. No rash noted.    Labs reviewed: Basic Metabolic Panel:  Recent Labs  03/14/17  NA 142  K 4.1  BUN 36*  CREATININE 0.9   Liver Function Tests:  Recent Labs  03/09/17  AST 21  ALT 29  ALKPHOS 79   No results for input(s): LIPASE, AMYLASE in the last 8760 hours. No results for input(s): AMMONIA in the last 8760 hours.  CBC:  Recent Labs  03/13/17  WBC 9.9  HGB 13.9  HCT 41  PLT 158    Imaging and Procedures obtained prior to SNF admission: Xr Chest Portable 03/09/17  Impression. Partial clearing of bilateral pulmonary infiltrates/edema. Mild residual noted both lung bases. Follow chest x-rays demonstrates complete clearing suggest. Cardiac pacer with lead tip sin right atrium right ventricle. Cardiomegaly. No pulmonary venous congestion.  Xr Chest Ap Portable 03/09/17 Impression. Findings consistent with congestive heart failure. There is slightly more interstitial alveolar edema compared to most recent study. Cardiac silhouette is stable. There is aortic atherosclerosis. It should be noted that mild superimposed pneumonia in the left mid lung cannot be excluded radio graphically. Aortic atherosclerosis.   Xr Chest Ap Portable 02/25/17 Impression. Low borderline cardiomegaly with similar appearance of pulmonary vascular congestion. Stale slight increase in left lower lobe airspace disease likely reflects atelectasis. Early infection is not excluded. Left pleural effusion.  Xr Chest Ap Portable 02/23/17 Impression. Diffuse parenchymal changes suggestive of pulmonary edema. Sequential pacemaker in place with top-normal heart size. Aortic atherosclerosis.  CT Chest Wo Contrast 03/09/17 Impression.  Multifocal bilateral ground-glass densities with somewhat perihilar distribution, could be due to pulmonary edema although multifocal pneumonia could also produce this appearance. There are small bilateral effusions and lower lobe consolidations which may reflect atelectasis or consolidative pneumonia. Right adrenal gland calcification may be due to prior hemorrhage. No masslike features. Aortic atherosclerosis.    Assessment/Plan  Altered mental state With fever and hypotension. Concern for SIRS. Send cbc with diff, cmp to rule out infectious etiology and metabolic cause. His dementia and deconditioning could be contributing some as well. cxr and u/a with c/s as well   Fever Tylenol 650 mg x 1 now, labs as above to rule out infectious etiology. With his history of multiple myeloma, monitor closely.   cough Recent hospital admission with acute respiratory failure thought to be from pulmonary edema. There also was concern for pneumonia but this was later deferred and no antibiotic was given. Given his recent fever and high aspiration risk, obtain chest xray to rule out infiltrate. Tylenol 650 mg po x 1 now. Monitor vital signs. Aspiration precautions, assistance with each meals and SLP to follow.   Hypotension On multiple antihypertensives and diuretic. Likely iatrogenic but given his fever, concern for SIRS present. Add holding parameter to metoprolol, lasix and lisinopril. D/c amlodipine for now. No signs of fluid overload. After lab review, if indicated, provide iv fluids. Monitor vital signs. With his history of multiple myeloma, monitor closely.    Family/ staff Communication: reviewed care plan with patient and charge nurse.    Labs/tests ordered: as above  Blanchie Serve, MD Internal Medicine [place], [place] [address] Cell Phone (Monday-Friday 8 am - 5 pm): [phone] On Call: [phone] and follow prompts after 5 pm  and on weekends Office Phone: [phone] Office Fax: [phone]

## 2017-03-19 ENCOUNTER — Non-Acute Institutional Stay (SKILLED_NURSING_FACILITY): Payer: Medicare Other | Admitting: Internal Medicine

## 2017-03-19 ENCOUNTER — Encounter: Payer: Self-pay | Admitting: Internal Medicine

## 2017-03-19 ENCOUNTER — Encounter: Payer: Self-pay | Admitting: Student

## 2017-03-19 DIAGNOSIS — R4189 Other symptoms and signs involving cognitive functions and awareness: Secondary | ICD-10-CM

## 2017-03-19 DIAGNOSIS — I509 Heart failure, unspecified: Secondary | ICD-10-CM | POA: Insufficient documentation

## 2017-03-19 DIAGNOSIS — E871 Hypo-osmolality and hyponatremia: Secondary | ICD-10-CM | POA: Diagnosis not present

## 2017-03-19 DIAGNOSIS — R05 Cough: Secondary | ICD-10-CM

## 2017-03-19 DIAGNOSIS — I959 Hypotension, unspecified: Secondary | ICD-10-CM

## 2017-03-19 DIAGNOSIS — D72825 Bandemia: Secondary | ICD-10-CM | POA: Diagnosis not present

## 2017-03-19 DIAGNOSIS — R059 Cough, unspecified: Secondary | ICD-10-CM

## 2017-03-19 NOTE — Progress Notes (Signed)
Provider:  Blanchie Serve MD  Location:  [facility] Room Number: 53 Place of Service:  SNF (31)  PCP: No primary care provider on file. No care team member to display  Extended Emergency Contact Information Primary Emergency Contact: [facility] Address: [address] [place]          [place], [place] [address] [place] Phone: [phone] Relation: Spouse Secondary Emergency Contact: Gibson,Sarah Address: [address]          [place], [place] [address] [place] Phone: [phone] Relation: Other  Code Status: DNR  Goals of Care: Advanced Directive information Advanced Directives 03/19/2017  Does Patient Have a Medical Advance Directive? Yes  Type of Advance Directive Out of facility DNR (pink MOST or yellow form)  Does patient want to make changes to medical advance directive? No - Patient declined  Pre-existing out of facility DNR order (yellow form or pink MOST form) Yellow form placed in chart (order not valid for inpatient use)      Chief Complaint  Patient presents with  . Acute Visit    Abnormal labs    HPI: Patient is a 83 y.o. male seen today for acute visit for follow up on his fever, hypotension and change of mental state. He has been afebrile since last temp spike. BP remains on lower side. He is alert but confused. He has dementia and this limits his HPI and ROS some. He is here for rehab post recent hospital admission for acute respiratory failure with pulmonary edema. He denies any trouble with breathing.    Past Medical History:  Diagnosis Date  . Anemia   . Arthritis   . Hypertension    Past Surgical History:  Procedure Laterality Date  . EYE SURGERY     cataract surgery  . FRACTURE SURGERY     left hemiarthroplasty 10/15/13 Dr. Alfredia Client  . HEMORRHOID SURGERY    . JOINT REPLACEMENT     right knee replacement by Dr. Onnie Graham  . PACEMAKER PLACEMENT    . PROSTATE SURGERY        reports that he has never smoked. He does not have any smokeless tobacco history on file. He reports that he does not drink alcohol. His drug history is not on file. Social History   Social History  . Marital status: Married    Spouse name: N/A  . Number of children: N/A  . Years of education: N/A   Occupational History  . Not on file.   Social History Main Topics  . Smoking status: Never Smoker  . Smokeless tobacco: Not on file  . Alcohol use No  . Drug use: Unknown  . Sexual activity: Not on file   Other Topics Concern  . Not on file   Social History Narrative  . No narrative on file    Functional Status Survey:    Family History  Problem Relation Age of Onset  . Hypertension Unknown   . Bladder Cancer Unknown   . Prostate cancer Unknown     Health Maintenance  Topic Date Due  . TETANUS/TDAP  05/25/1947  . PNA vac Low Risk Adult (1 of 2 - PCV13) 05/24/1993  . INFLUENZA VACCINE  01/20/2017    Allergies  Allergen Reactions  . Codeine   . Hctz [Hydrochlorothiazide]   . Lipitor [Atorvastatin]   . Quinine Derivatives   . Sulfa Antibiotics     Outpatient Encounter Prescriptions as  of 03/19/2017  Medication Sig  . acetaminophen (TYLENOL) 325 MG tablet Take 650 mg by mouth at bedtime as needed.   Marland Kitchen acetaminophen (TYLENOL) 325 MG tablet Take 650 mg by mouth every 6 (six) hours as needed.  Marland Kitchen acyclovir (ZOVIRAX) 400 MG tablet Take 400 mg by mouth daily.  . Calcium 500 MG CHEW Chew 1 tablet by mouth daily.  . carboxymethylcellulose (REFRESH PLUS) 0.5 % SOLN Place 1 drop into both eyes as needed.  Marland Kitchen dexamethasone (DECADRON) 2 MG tablet Take 10 tablets (20 mg total) by mouth once a week.  . ferrous sulfate 325 (65 FE) MG EC tablet Take 325 mg by mouth daily with breakfast.  . furosemide (LASIX) 40 MG tablet Take 40 mg by mouth daily.  Marland Kitchen lisinopril (PRINIVIL,ZESTRIL) 2.5 MG tablet Take 2.5 mg by mouth daily.  . metoprolol tartrate (LOPRESSOR) 25 MG tablet Take  12.5 mg by mouth daily.   . MULTIPLE VITAMIN PO Take 1 tablet by mouth daily.  Marland Kitchen senna-docusate (SENOKOT-S) 8.6-50 MG per tablet Take 1 tablet by mouth daily as needed.   . simvastatin (ZOCOR) 10 MG tablet Take 10 mg by mouth daily.  Marland Kitchen warfarin (COUMADIN) 2.5 MG tablet Take 2.5 mg by mouth daily.  . [DISCONTINUED] amLODipine (NORVASC) 5 MG tablet Take 5 mg by mouth daily.   No facility-administered encounter medications on file as of 03/19/2017.     Review of Systems  Constitutional: Positive for appetite change. Negative for chills, fatigue and fever.       Energy level is slowly coming back.   HENT: Negative for congestion, mouth sores, rhinorrhea, sore throat and trouble swallowing.   Eyes: Positive for visual disturbance.       Wears glasses.   Respiratory: Positive for cough. Negative for chest tightness, shortness of breath and wheezing.        Breathing has improved per patients wife.  Cardiovascular: Negative for chest pain, palpitations and leg swelling.  Gastrointestinal: Negative for abdominal pain, constipation, diarrhea, nausea and vomiting.  Genitourinary: Negative for dysuria, flank pain and hematuria.  Musculoskeletal: Positive for gait problem. Negative for arthralgias and back pain.  Skin: Negative for rash and wound.  Neurological: Positive for weakness. Negative for dizziness, light-headedness and headaches.  Psychiatric/Behavioral: Positive for confusion. Negative for agitation and behavioral problems.    Vitals:   03/19/17 1056  BP: 108/68  Pulse: 70  Resp: 18  Temp: 97.6 F (36.4 C)  TempSrc: Oral  SpO2: 94%  Weight: 129 lb 11.2 oz (58.8 kg)  Height: 5\' 9"  (1.753 m)   Body mass index is 19.15 kg/m.   Wt Readings from Last 3 Encounters:  03/19/17 129 lb 11.2 oz (58.8 kg)  03/18/17 129 lb 11.2 oz (58.8 kg)  03/16/17 127 lb 4.8 oz (57.7 kg)    Physical Exam  Constitutional:  Frail elderly male in no acute distress  HENT:  Head: Normocephalic and  atraumatic.  Mouth/Throat: Oropharynx is clear and moist.  Eyes: Pupils are equal, round, and reactive to light. Conjunctivae and EOM are normal.  Has corrective glasses  Neck: Normal range of motion. Neck supple. No thyromegaly present.  Cardiovascular: Normal rate and regular rhythm.   Pulmonary/Chest: Effort normal. No respiratory distress. He has no wheezes. He has no rales.  Decreased air entry to lung bases  Abdominal: Soft. Bowel sounds are normal. There is no tenderness. There is no rebound and no guarding.  Musculoskeletal: He exhibits no edema.  Can move all 4 extremities,  generalized weakness  Lymphadenopathy:    He has no cervical adenopathy.  Neurological: He is alert.  Oriented to self only  Skin: Skin is warm and dry. No rash noted.    Labs reviewed: Basic Metabolic Panel:  Recent Labs  03/14/17 03/18/17  NA 142 134*  K 4.1 3.8  BUN 36* 33*  CREATININE 0.9 0.8   Liver Function Tests:  Recent Labs  03/09/17 03/18/17  AST 21 16  ALT 29 20  ALKPHOS 79 62   No results for input(s): LIPASE, AMYLASE in the last 8760 hours. No results for input(s): AMMONIA in the last 8760 hours. CBC:  Recent Labs  03/13/17 03/18/17  WBC 9.9 11.4  HGB 13.9 13.0*  HCT 41 39*  PLT 158 172    Imaging and Procedures obtained prior to SNF admission: Xr Chest Portable 03/09/17  Impression. Partial clearing of bilateral pulmonary infiltrates/edema. Mild residual noted both lung bases. Follow chest x-rays demonstrates complete clearing suggest. Cardiac pacer with lead tip sin right atrium right ventricle. Cardiomegaly. No pulmonary venous congestion.  Xr Chest Ap Portable 03/09/17 Impression. Findings consistent with congestive heart failure. There is slightly more interstitial alveolar edema compared to most recent study. Cardiac silhouette is stable. There is aortic atherosclerosis. It should be noted that mild superimposed pneumonia in the left mid lung cannot be excluded radio  graphically. Aortic atherosclerosis.   Xr Chest Ap Portable 02/25/17 Impression. Low borderline cardiomegaly with similar appearance of pulmonary vascular congestion. Stale slight increase in left lower lobe airspace disease likely reflects atelectasis. Early infection is not excluded. Left pleural effusion.  Xr Chest Ap Portable 02/23/17 Impression. Diffuse parenchymal changes suggestive of pulmonary edema. Sequential pacemaker in place with top-normal heart size. Aortic atherosclerosis.  CT Chest Wo Contrast 03/09/17 Impression. Multifocal bilateral ground-glass densities with somewhat perihilar distribution, could be due to pulmonary edema although multifocal pneumonia could also produce this appearance. There are small bilateral effusions and lower lobe consolidations which may reflect atelectasis or consolidative pneumonia. Right adrenal gland calcification may be due to prior hemorrhage. No masslike features. Aortic atherosclerosis.   03/18/17 chest xray- no acute infiltrate or pulmonary edema   Assessment/Plan  Hypotension BP remains on low side. Afebrile. Lab work shows mild hypovolemia with elevated BUN and normal creatinine. Mild hyponatremia noted. Encourage and maintain hydration. Amlodipine was discontinued yesterday. D/c lisinopril for now. Decrease furosemide to 20 mg daily with holding paraemeters. Check BP q shift manually. He has been able to work with therapy and denies dizziness. Also check orthostasis. Check bmp 03/23/17  cough inflitrates has been ruled out. No pulmonary edema noted. Occasional cough for now. Aspiration precautions. Continue to work with SLP  Hyponatremia Encourage fluid intake and maintain hydration  Leukocytosis With mild left shift. Afebrile. Chest xray negative. His mental state appears improved from yesterday. Hold off on antibiotic for now and f/u on urine study. Recheck cbc with diff 03/23/17  Cognitive impairment With change in mental state  yesterday, improved today. His dementia and hypovolemia likely contributing. Encourage hydration.   Family/ staff Communication: reviewed care plan with patient and charge nurse.    Labs/tests ordered: cbc with diff, cmp  Blanchie Serve, MD Internal Medicine [facility] [address] [place], [place] [address] Cell Phone (Monday-Friday 8 am - 5 pm): [phone] On Call: [phone] and follow prompts after 5 pm and on weekends Office Phone: [phone] Office Fax: [phone]

## 2017-03-23 ENCOUNTER — Non-Acute Institutional Stay (SKILLED_NURSING_FACILITY): Payer: Medicare Other | Admitting: Adult Health

## 2017-03-23 ENCOUNTER — Encounter: Payer: Self-pay | Admitting: Adult Health

## 2017-03-23 DIAGNOSIS — Z7901 Long term (current) use of anticoagulants: Secondary | ICD-10-CM | POA: Diagnosis not present

## 2017-03-23 DIAGNOSIS — I480 Paroxysmal atrial fibrillation: Secondary | ICD-10-CM | POA: Diagnosis not present

## 2017-03-23 LAB — CBC AND DIFFERENTIAL
HEMATOCRIT: 38 — AB (ref 41–53)
Hemoglobin: 12.9 — AB (ref 13.5–17.5)
PLATELETS: 229 (ref 150–399)

## 2017-03-23 LAB — BASIC METABOLIC PANEL
BUN: 23 — AB (ref 4–21)
Creatinine: 0.7 (ref <=1.3)
GLUCOSE: 148
Potassium: 4.1 (ref 3.4–5.3)
Sodium: 138 (ref 137–147)

## 2017-03-23 NOTE — Progress Notes (Signed)
Subjective:     Indication: atrial fibrillation Bleeding signs/symptoms: None Thromboembolic signs/symptoms: None  Missed Coumadin doses: None Medication changes: no Dietary changes: no Bacterial/viral infection: no Other concerns: no  The following portions of the patient's history were reviewed and updated as appropriate: allergies, current medications, past family history, past medical history, past social history, past surgical history and problem list.  Review of Systems A comprehensive review of systems was negative.   Objective:    INR Today: 1.5 Current dose:  Coumadin 2 mg daily  Assessment:    Subtherapeutic INR for goal of 2-3   Plan:    1. New dose: Increase Coumadin from 2 mg to 2.5 mg daily   2. Next INR: 03/25/17

## 2017-03-25 ENCOUNTER — Encounter: Payer: Self-pay | Admitting: Adult Health

## 2017-03-25 ENCOUNTER — Other Ambulatory Visit: Payer: Self-pay | Admitting: *Deleted

## 2017-03-25 ENCOUNTER — Non-Acute Institutional Stay (SKILLED_NURSING_FACILITY): Payer: Medicare Other | Admitting: Adult Health

## 2017-03-25 DIAGNOSIS — D72829 Elevated white blood cell count, unspecified: Secondary | ICD-10-CM

## 2017-03-25 DIAGNOSIS — R600 Localized edema: Secondary | ICD-10-CM

## 2017-03-25 DIAGNOSIS — Z7901 Long term (current) use of anticoagulants: Secondary | ICD-10-CM

## 2017-03-25 DIAGNOSIS — I480 Paroxysmal atrial fibrillation: Secondary | ICD-10-CM

## 2017-03-25 LAB — BASIC METABOLIC PANEL
BUN: 24 — AB (ref 4–21)
CREATININE: 0.7 (ref <=1.3)
GLUCOSE: 129
POTASSIUM: 3.7 (ref 3.4–5.3)
SODIUM: 137 (ref 137–147)

## 2017-03-25 LAB — PROTIME-INR: Protime: 40.5 — AB (ref 10.0–13.8)

## 2017-03-25 LAB — POCT INR: INR: 3.9 — AB (ref <=1.1)

## 2017-03-25 NOTE — Progress Notes (Signed)
DATE:  03/25/2017   MRN:  [id number]  BIRTHDAY: 09-21-1925  Facility:  Nursing Home Location: [facility] Room Number: 56  LEVEL OF CARE:  SNF (31)  Contact Information    Name Relation Home Work Mobile   [place] Spouse [phone]     Blenda Peals [phone]         Code Status History    This patient does not have a recorded code status. Please follow your organizational policy for patients in this situation.    Advance Directive Documentation     Most Recent Value  Type of Advance Directive  Out of facility DNR (pink MOST or yellow form)  Pre-existing out of facility DNR order (yellow form or pink MOST form)  Yellow form placed in chart (order not valid for inpatient use)  "MOST" Form in Place?  -       Chief Complaint  Patient presents with  . Acute Visit    Significant weight gain   HISTORY OF PRESENT ILLNESS:  This is an 83 year old male who is being seen for an acute visit. He is currently having a short-term rehabilitation @ [facility]. He was reported to have gained 7.7 lbs in 1 week. He was given an extra dose of Lasix 20 mg yesterday, He was seen in his room today with wife at bedside. No SOB noted. Latest INR 3.9. No bruising nor bleeding noted. He takes Coumadin for history of DVT and Atrial fibrillation. Latest wbc 12.9, elevated. No fever nor cough. He is currently on chemotherapy for multiple myeloma. He has PMH of dysphagia, HTN, a-fib, and neoplasm of prostate.     PAST MEDICAL HISTORY:  Past Medical History:  Diagnosis Date  . Anemia   . Arthritis   . Hypertension      CURRENT MEDICATIONS: Reviewed  Patient's Medications  New Prescriptions   No medications on file  Previous Medications   ACETAMINOPHEN (TYLENOL) 325 MG TABLET    Take 650 mg by mouth at bedtime as needed.    ACETAMINOPHEN (TYLENOL) 325 MG TABLET    Take 650 mg by mouth every 6 (six) hours as needed.   ACYCLOVIR (ZOVIRAX) 400  MG TABLET    Take 400 mg by mouth daily.   CARBOXYMETHYLCELLULOSE (REFRESH PLUS) 0.5 % SOLN    Place 1 drop into both eyes as needed.   DEXAMETHASONE (DECADRON) 2 MG TABLET    Take 10 tablets (20 mg total) by mouth once a week.   FENOPROFEN (NALFON) 600 MG TABS TABLET    Take 600 mg by mouth daily.   FERROUS SULFATE 325 (65 FE) MG EC TABLET    Take 325 mg by mouth daily with breakfast.   FUROSEMIDE (LASIX) 40 MG TABLET    Take 40 mg by mouth daily.   METOPROLOL TARTRATE (LOPRESSOR) 25 MG TABLET    Take 12.5 mg by mouth daily.    MULTIPLE VITAMIN PO    Take 1 tablet by mouth daily.   SENNA-DOCUSATE (SENOKOT-S) 8.6-50 MG PER TABLET    Take 1 tablet by mouth daily as needed.    SIMVASTATIN (ZOCOR) 10 MG TABLET    Take 10 mg by mouth daily.   WARFARIN (COUMADIN) 2.5 MG TABLET    Take 2.5 mg by mouth daily.  Modified Medications   No medications on file  Discontinued Medications   CALCIUM 500 MG CHEW    Chew 1 tablet by mouth daily.   LISINOPRIL (PRINIVIL,ZESTRIL)  2.5 MG TABLET    Take 2.5 mg by mouth daily.     Allergies  Allergen Reactions  . Codeine   . Hctz [Hydrochlorothiazide]   . Lipitor [Atorvastatin]   . Quinine Derivatives   . Sulfa Antibiotics      REVIEW OF SYSTEMS:  GENERAL: no fever, chills MOUTH and THROAT: Denies oral discomfort, gingival pain or bleeding  RESPIRATORY: no cough, SOB, DOE, wheezing, hemoptysis CARDIAC: no chest pain, or palpitations GI: no abdominal pain, diarrhea, constipation, heart burn, nausea or vomiting GU: Denies dysuria, frequency, hematuria, incontinence, or discharge PSYCHIATRIC: Denies feeling of depression or anxiety. No report of hallucinations, insomnia, paranoia, or agitation    PHYSICAL EXAMINATION  GENERAL APPEARANCE:  In no acute distress. Normal body habitus SKIN:  Skin is warm and dry.  MOUTH and THROAT: Lips are without lesions. Oral mucosa is moist and without lesions. RESPIRATORY: breathing is even & unlabored, BS  CTAB CARDIAC: RRR, no murmur,no extra heart sounds, BLE trace edema, +pacemaker on left chest GI: abdomen soft, normal BS, no masses, no tenderness EXTREMITIES:  Able to move X 4 extremities PSYCHIATRIC: Alert and oriented X 3. Affect and behavior are appropriate   LABS/RADIOLOGY: Labs reviewed: Basic Metabolic Panel:  Recent Labs  03/14/17 03/18/17  NA 142 134*  K 4.1 3.8  BUN 36* 33*  CREATININE 0.9 0.8   Liver Function Tests:  Recent Labs  03/09/17 03/18/17  AST 21 16  ALT 29 20  ALKPHOS 79 62   CBC:  Recent Labs  03/13/17 03/18/17  WBC 9.9 11.4  HGB 13.9 13.0*  HCT 41 39*  PLT 158 172       ASSESSMENT/PLAN:  1. Edema of lower extremity - continue Lasix 20 mg daily and to hold for SBP<= 110 since BPs were soft  2. Long term current use of anticoagulant - INR 3.9, supratherapeutic, will hold Coumadin today and check INR on 03/26/17  3. Paroxysmal atrial fibrillation (HCC) - rate-controlled, continue Metoprolol tartrate 12.5 mg daily  4. Leukocytosis, unspecified type - wbc 12.9, elevated, no fever, instructed charge nurse to fax lab result done on 03/23/17 to oncology      [place]. [place] - NP [employer] [phone]

## 2017-03-29 ENCOUNTER — Non-Acute Institutional Stay (SKILLED_NURSING_FACILITY): Payer: Medicare Other | Admitting: Internal Medicine

## 2017-03-29 ENCOUNTER — Encounter: Payer: Self-pay | Admitting: Internal Medicine

## 2017-03-29 DIAGNOSIS — I5030 Unspecified diastolic (congestive) heart failure: Secondary | ICD-10-CM | POA: Diagnosis not present

## 2017-03-29 DIAGNOSIS — R635 Abnormal weight gain: Secondary | ICD-10-CM | POA: Diagnosis not present

## 2017-03-29 DIAGNOSIS — R0602 Shortness of breath: Secondary | ICD-10-CM | POA: Diagnosis not present

## 2017-03-29 NOTE — Progress Notes (Signed)
Provider:  Blanchie Serve MD  Location:  [place] Room Number: 26 Place of Service:  SNF (31)  PCP: No primary care provider on file. No care team member to display  Extended Emergency Contact Information Primary Emergency Contact: [facility] Address: [address] [place]          [place], [place] [address] [place] Phone: [phone] Relation: Spouse Secondary Emergency Contact: Gibson,Sarah Address: [address]          [place], [place] [address] [place]itter of [place] Phone: [phone] Relation: Other  Code Status: DNR  Goals of Care: Advanced Directive information Advanced Directives 03/25/2017  Does Patient Have a Medical Advance Directive? Yes  Type of Advance Directive Out of facility DNR (pink MOST or yellow form)  Does patient want to make changes to medical advance directive? No - Patient declined  Pre-existing out of facility DNR order (yellow form or pink MOST form) Yellow form placed in chart (order not valid for inpatient use)      Chief Complaint  Patient presents with  . Acute Visit    Weight gain    HPI: Patient is a 83 y.o. male seen today for acute visit for weight gain. Per nursing he has gained several pounds over last week. He is working with therapy team but they mention him needing to take frequent brake for last few days to catch his breath. He is seen in his room with his wife at bedside. He denies any dyspnea or cough. he denies chest pain. He is using 1 pillow to sleep and denies orthopnea or PND. Appetite is good and BP holding good on review.    Past Medical History:  Diagnosis Date  . Anemia   . Arthritis   . Hypertension    Past Surgical History:  Procedure Laterality Date  . EYE SURGERY     cataract surgery  . FRACTURE SURGERY     left hemiarthroplasty 10/15/13 Dr. Alfredia Client  . HEMORRHOID SURGERY    . JOINT REPLACEMENT     right knee replacement by Dr. Onnie Graham  .  PACEMAKER PLACEMENT    . PROSTATE SURGERY      reports that he has never smoked. He has never used smokeless tobacco. He reports that he does not drink alcohol or use drugs. Social History   Social History  . Marital status: Married    Spouse name: N/A  . Number of children: N/A  . Years of education: N/A   Occupational History  . Not on file.   Social History Main Topics  . Smoking status: Never Smoker  . Smokeless tobacco: Never Used  . Alcohol use No  . Drug use: No  . Sexual activity: Not on file   Other Topics Concern  . Not on file   Social History Narrative  . No narrative on file    Functional Status Survey:    Family History  Problem Relation Age of Onset  . Hypertension Unknown   . Bladder Cancer Unknown   . Prostate cancer Unknown     Health Maintenance  Topic Date Due  . INFLUENZA VACCINE  03/31/2017 (Originally 01/20/2017)  . PNA vac Low Risk Adult (1 of 2 - PCV13) 04/22/2017 (Originally 05/24/1993)  . TETANUS/TDAP  06/22/2018 (Originally 05/25/1945)    Allergies  Allergen Reactions  . Codeine   . Hctz [Hydrochlorothiazide]   . Lipitor [Atorvastatin]   . Quinine Derivatives   .  Sulfa Antibiotics     Outpatient Encounter Prescriptions as of 03/29/2017  Medication Sig  . acetaminophen (TYLENOL) 325 MG tablet Take 325 mg by mouth at bedtime as needed.   Marland Kitchen acetaminophen (TYLENOL) 325 MG tablet Take 650 mg by mouth every 6 (six) hours as needed.  Marland Kitchen acyclovir (ZOVIRAX) 400 MG tablet Take 400 mg by mouth daily.  . calcium carbonate (OSCAL) 1500 (600 Ca) MG TABS tablet Take 600 mg of elemental calcium by mouth daily.  . carboxymethylcellulose (REFRESH PLUS) 0.5 % SOLN Place 1 drop into both eyes as needed.  Marland Kitchen dexamethasone (DECADRON) 2 MG tablet Take 10 tablets (20 mg total) by mouth once a week.  . ferrous sulfate 325 (65 FE) MG EC tablet Take 325 mg by mouth daily with breakfast.  . furosemide (LASIX) 20 MG tablet Take 20 mg by mouth daily.  .  metoprolol tartrate (LOPRESSOR) 25 MG tablet Take 12.5 mg by mouth daily.   . MULTIPLE VITAMIN PO Take 1 tablet by mouth daily.  Marland Kitchen senna-docusate (SENOKOT-S) 8.6-50 MG per tablet Take 1 tablet by mouth daily as needed.   . simvastatin (ZOCOR) 10 MG tablet Take 10 mg by mouth at bedtime.   Marland Kitchen warfarin (COUMADIN) 2 MG tablet Take 2 mg by mouth daily.  . [DISCONTINUED] fenoprofen (NALFON) 600 MG TABS tablet Take 600 mg by mouth daily.  . [DISCONTINUED] furosemide (LASIX) 40 MG tablet Take 40 mg by mouth daily.  . [DISCONTINUED] warfarin (COUMADIN) 2.5 MG tablet Take 2.5 mg by mouth daily.   No facility-administered encounter medications on file as of 03/29/2017.     Review of Systems  Reason unable to perform ROS: limited with dementia.  Constitutional: Positive for fatigue. Negative for appetite change, chills and fever.       Energy level is slowly coming back.   HENT: Negative for congestion, mouth sores, rhinorrhea, sore throat and trouble swallowing.   Eyes: Positive for visual disturbance.       Wears glasses.   Respiratory: Negative for cough, chest tightness, shortness of breath and wheezing.        Breathing has improved per patients wife.  Cardiovascular: Positive for leg swelling. Negative for chest pain and palpitations.  Gastrointestinal: Negative for abdominal pain, constipation, diarrhea, nausea and vomiting.  Genitourinary: Negative for dysuria.  Musculoskeletal: Positive for gait problem. Negative for arthralgias and back pain.       Needs assistance with transfer and using wheelchair  Skin: Negative for rash and wound.  Neurological: Positive for weakness. Negative for dizziness, light-headedness and headaches.  Psychiatric/Behavioral: Positive for confusion. Negative for agitation and behavioral problems.    Vitals:   03/29/17 1157  BP: 136/74  Pulse: 70  Resp: 18  Temp: 98.9 F (37.2 C)  TempSrc: Oral  SpO2: 96%  Weight: 141 lb 4.8 oz (64.1 kg)  Height: 5\' 9"   (1.753 m)   Body mass index is 20.87 kg/m.   Wt Readings from Last 3 Encounters:  03/29/17 141 lb 4.8 oz (64.1 kg)  03/25/17 137 lb 6.4 oz (62.3 kg)  03/23/17 129 lb 11.2 oz (58.8 kg)    Physical Exam  Constitutional:  Frail elderly male in no acute distress  HENT:  Head: Normocephalic and atraumatic.  Mouth/Throat: Oropharynx is clear and moist.  Eyes: Pupils are equal, round, and reactive to light. Conjunctivae and EOM are normal.  Has corrective glasses  Neck: Normal range of motion. Neck supple. No thyromegaly present.  Cardiovascular: Normal rate and regular  rhythm.   Pulmonary/Chest: Effort normal. No respiratory distress. He has no wheezes. He has no rales.  Decreased air entry to lung bases  Abdominal: Soft. Bowel sounds are normal. There is no tenderness. There is no rebound and no guarding.  Musculoskeletal: He exhibits edema.  Can move all 4 extremities, generalized weakness, 1+ pitting edema present  Lymphadenopathy:    He has no cervical adenopathy.  Neurological: He is alert.  Oriented to self only  Skin: Skin is warm and dry. No rash noted.    Labs reviewed: Basic Metabolic Panel:  Recent Labs  03/18/17 03/23/17 03/25/17  NA 134* 138 137  K 3.8 4.1 3.7  BUN 33* 23* 24*  CREATININE 0.8 0.7 0.7   Liver Function Tests:  Recent Labs  03/09/17 03/18/17  AST 21 16  ALT 29 20  ALKPHOS 79 62   No results for input(s): LIPASE, AMYLASE in the last 8760 hours. No results for input(s): AMMONIA in the last 8760 hours. CBC:  Recent Labs  03/13/17 03/18/17 03/23/17  WBC 9.9 11.4  --   HGB 13.9 13.0* 12.9*  HCT 41 39* 38*  PLT 158 172 229     Assessment/Plan  Weight gain Likely from fluid retention. Will adjust lasix dosing as below. Given his history of low BP, monitor BP closely.   Dyspnea Has had pulmonary edema requiring hospitalization recently. With his weight gain and dyspnea, concern for acute exacerbation of diastolic CHF. This along with  deconditioning could be contributing to his dyspnea. Will increase his lasix to 40 mg in am and 20 mg pm for now with holding parameter and add kcl supplement. Check bmp and daily weight for now. Monitor o2 sat with exertion and rest. If no improvement, consider chest xray.   Diastolic chf Continue current regimen of metoprolol. Off lisinopril with low BP in past. Adjusting dose of lasix. Monitor BP closely to avoid hypotension  Family/ staff Communication: reviewed care plan with patient, his wife and charge nurse.    Labs/tests ordered: bmp, magnesium on 04/01/17 and daily weight  Blanchie Serve, MD Internal Medicine [facility] [address] [place], [place] [address] Cell Phone (Monday-Friday 8 am - 5 pm): [phone] On Call: [phone] and follow prompts after 5 pm and on weekends Office Phone: [phone] Office Fax: [phone]

## 2017-03-30 LAB — BASIC METABOLIC PANEL
BUN: 19 (ref 4–21)
CREATININE: 0.7 (ref 0.6–1.3)
Glucose: 163
POTASSIUM: 4.4 (ref 3.4–5.3)
Sodium: 138 (ref 137–147)

## 2017-03-30 LAB — POCT INR: INR: 1.9 — AB (ref 0.9–1.1)

## 2017-04-01 ENCOUNTER — Non-Acute Institutional Stay (SKILLED_NURSING_FACILITY): Payer: Medicare Other | Admitting: Internal Medicine

## 2017-04-01 ENCOUNTER — Encounter: Payer: Self-pay | Admitting: Internal Medicine

## 2017-04-01 DIAGNOSIS — I5032 Chronic diastolic (congestive) heart failure: Secondary | ICD-10-CM

## 2017-04-01 DIAGNOSIS — W19XXXA Unspecified fall, initial encounter: Secondary | ICD-10-CM | POA: Diagnosis not present

## 2017-04-01 DIAGNOSIS — Z7189 Other specified counseling: Secondary | ICD-10-CM | POA: Diagnosis not present

## 2017-04-01 NOTE — Progress Notes (Signed)
Provider:  Blanchie Serve MD  Location:  [place] Room Number: 62 Place of Service:  SNF (31)  PCP: No primary care provider on file. No care team member to display  Extended Emergency Contact Information Primary Emergency Contact: [facility] Address: [address] [place]          [place], [place] [address] [place] Phone: [phone] Relation: Spouse Secondary Emergency Contact: Gibson,Sarah Address: [address]          [place], [place] [address] [place] of [place] Phone: [phone] Relation: Other  Code Status: DNR  Goals of Care: Advanced Directive information Advanced Directives 04/01/2017  Does Patient Have a Medical Advance Directive? Yes  Type of Advance Directive Out of facility DNR (pink MOST or yellow form)  Does patient want to make changes to medical advance directive? No - Patient declined  Pre-existing out of facility DNR order (yellow form or pink MOST form) Yellow form placed in chart (order not valid for inpatient use)      Chief Complaint  Patient presents with  . Acute Visit    dyspnea, leg edema, goals of care counselling    HPI: Patient is a 83 y.o. male seen today for acute visit. Wife present at bedside.   Dyspnea- with weight gain and increased leg edema, he was seen few days back. His leg edema has decreased and he denies any dyspnea this visit.  Fall- had a fall this afternoon trying to get out of bathroom unassisted. Fell on his buttock and mentions being sore on his bottom. Denies hitting his head or any loss of consciousness.   Care plan discussion- wife present to review goals of care for patient. Social worker present as well. Living will and HCPOA paperwork in chart.    Past Medical History:  Diagnosis Date  . Anemia   . Arthritis   . Hypertension    Past Surgical History:  Procedure Laterality Date  . EYE SURGERY     cataract surgery  . FRACTURE SURGERY     left hemiarthroplasty 10/15/13 Dr. Alfredia Client  . HEMORRHOID SURGERY    . JOINT REPLACEMENT     right knee replacement by Dr. Onnie Graham  . PACEMAKER PLACEMENT    . PROSTATE SURGERY      reports that he has never smoked. He has never used smokeless tobacco. He reports that he does not drink alcohol or use drugs. Social History   Social History  . Marital status: Married    Spouse name: N/A  . Number of children: N/A  . Years of education: N/A   Occupational History  . Not on file.   Social History Main Topics  . Smoking status: Never Smoker  . Smokeless tobacco: Never Used  . Alcohol use No  . Drug use: No  . Sexual activity: Not on file   Other Topics Concern  . Not on file   Social History Narrative  . No narrative on file    Functional Status Survey:    Family History  Problem Relation Age of Onset  . Hypertension Unknown   . Bladder Cancer Unknown   . Prostate cancer Unknown     Health Maintenance  Topic Date Due  . INFLUENZA VACCINE  01/20/2017  . PNA vac Low Risk Adult (1 of 2 - PCV13) 04/22/2017 (Originally 05/24/1993)  . TETANUS/TDAP  06/22/2018 (Originally 05/25/1945)    Allergies  Allergen Reactions  . Codeine   .  Hctz [Hydrochlorothiazide]   . Lipitor [Atorvastatin]   . Quinine Derivatives   . Sulfa Antibiotics     Outpatient Encounter Prescriptions as of 04/01/2017  Medication Sig  . acetaminophen (TYLENOL) 325 MG tablet Take 325 mg by mouth at bedtime as needed.   Marland Kitchen acetaminophen (TYLENOL) 325 MG tablet Take 650 mg by mouth every 6 (six) hours as needed.  Marland Kitchen acyclovir (ZOVIRAX) 400 MG tablet Take 400 mg by mouth daily.  . calcium carbonate (OSCAL) 1500 (600 Ca) MG TABS tablet Take 600 mg of elemental calcium by mouth daily.  . carboxymethylcellulose (REFRESH PLUS) 0.5 % SOLN Place 1 drop into both eyes as needed.  Marland Kitchen dexamethasone (DECADRON) 2 MG tablet Take 10 tablets (20 mg total) by mouth once a week.  . ferrous sulfate 325 (65 FE) MG EC  tablet Take 325 mg by mouth daily with breakfast.  . furosemide (LASIX) 20 MG tablet Take 20 mg by mouth daily.  . furosemide (LASIX) 40 MG tablet Take 40 mg by mouth daily.  . metoprolol tartrate (LOPRESSOR) 25 MG tablet Take 12.5 mg by mouth daily.   . MULTIPLE VITAMIN PO Take 1 tablet by mouth daily.  . potassium chloride SA (K-DUR,KLOR-CON) 20 MEQ tablet Take 20 mEq by mouth daily.  Marland Kitchen senna-docusate (SENOKOT-S) 8.6-50 MG per tablet Take 1 tablet by mouth daily as needed.   . simvastatin (ZOCOR) 10 MG tablet Take 10 mg by mouth at bedtime.   Marland Kitchen warfarin (COUMADIN) 2 MG tablet Take 2 mg by mouth daily.   No facility-administered encounter medications on file as of 04/01/2017.     Review of Systems  Reason unable to perform ROS: limited with dementia.  Constitutional: Negative for appetite change, chills, fatigue and fever.       Energy level is slowly coming back.   HENT: Negative for congestion, mouth sores, rhinorrhea, sore throat and trouble swallowing.   Eyes: Positive for visual disturbance.       Wears glasses.   Respiratory: Negative for cough, chest tightness, shortness of breath and wheezing.        Breathing has improved per patients wife.  Cardiovascular: Positive for leg swelling. Negative for chest pain and palpitations.       Improved leg swelling per wife.   Gastrointestinal: Negative for abdominal pain, constipation, diarrhea, nausea and vomiting.  Genitourinary: Negative for dysuria.  Musculoskeletal: Positive for back pain and gait problem. Negative for arthralgias.       Needs assistance with transfer and using wheelchair  Skin: Negative for rash and wound.  Neurological: Positive for weakness. Negative for dizziness, light-headedness and headaches.       Poor safety awareness  Psychiatric/Behavioral: Positive for confusion. Negative for agitation and behavioral problems.    Vitals:   04/01/17 1126  BP: 138/90  Pulse: 66  Resp: 20  Temp: 97.9 F (36.6 C)    TempSrc: Oral  SpO2: 95%  Weight: 140 lb 8 oz (63.7 kg)  Height: 5\' 9"  (1.753 m)   Body mass index is 20.75 kg/m.   Wt Readings from Last 3 Encounters:  04/01/17 140 lb 8 oz (63.7 kg)  03/29/17 141 lb 4.8 oz (64.1 kg)  03/25/17 137 lb 6.4 oz (62.3 kg)    Physical Exam  Constitutional:  Frail elderly male in no acute distress  HENT:  Head: Normocephalic and atraumatic.  Mouth/Throat: Oropharynx is clear and moist.  Eyes: Pupils are equal, round, and reactive to light. Conjunctivae and EOM are normal.  Has corrective glasses  Neck: Normal range of motion. Neck supple. No thyromegaly present.  Cardiovascular: Normal rate and regular rhythm.   Pulmonary/Chest: Effort normal. No respiratory distress. He has no wheezes. He has no rales.  Decreased air entry to lung bases  Abdominal: Soft. Bowel sounds are normal. There is no tenderness. There is no rebound and no guarding.  Musculoskeletal: He exhibits edema.  Tenderness to sacral area on palpation. No bruise noted. Can move all 4 extremities, generalized weakness, 1+ pitting edema present  Lymphadenopathy:    He has no cervical adenopathy.  Neurological: He is alert.  Oriented to self only  Skin: Skin is warm and dry. No rash noted.    Labs reviewed: Basic Metabolic Panel:  Recent Labs  03/23/17 03/25/17 03/30/17  NA 138 137 138  K 4.1 3.7 4.4  BUN 23* 24* 19  CREATININE 0.7 0.7 0.7   Liver Function Tests:  Recent Labs  03/09/17 03/18/17  AST 21 16  ALT 29 20  ALKPHOS 79 62   No results for input(s): LIPASE, AMYLASE in the last 8760 hours. No results for input(s): AMMONIA in the last 8760 hours. CBC:  Recent Labs  03/13/17 03/18/17 03/23/17  WBC 9.9 11.4  --   HGB 13.9 13.0* 12.9*  HCT 41 39* 38*  PLT 158 172 229     Assessment/Plan  Fall initial encounter Unsteady gait and poor safety awareness. Remains high fall risk. Monitor and encourage safe transfer.  Diastolic CHF No significant weight  change noted. Continue current regimen for lasix and kcl. Monitor daily weight. Breathing currently stable.   Goals of care discussion Reviewed goals of care with patient's wife who is his HCPOA. Social work present during the visit. Goals of care reviewed and MOST form filled out between 3 pm to 3:30 pm. Patient is now DNR. DNR form has been signed. No hospitalization and to focus on comfort measures. Iv fluids and antibiotic if indicated for defined trial period only. No feeding tube desired.    Family/ staff Communication: reviewed care plan with patient, his wife and charge nurse.    Labs/tests ordered: none  Blanchie Serve, MD Internal Medicine [facility] [address] [place], [place] [address] Cell Phone (Monday-Friday 8 am - 5 pm): [phone] On Call: [phone] and follow prompts after 5 pm and on weekends Office Phone: [phone] Office Fax: [phone]

## 2017-04-05 ENCOUNTER — Non-Acute Institutional Stay (SKILLED_NURSING_FACILITY): Payer: Medicare Other | Admitting: Nurse Practitioner

## 2017-04-05 ENCOUNTER — Encounter: Payer: Self-pay | Admitting: Nurse Practitioner

## 2017-04-05 DIAGNOSIS — G629 Polyneuropathy, unspecified: Secondary | ICD-10-CM | POA: Diagnosis not present

## 2017-04-05 DIAGNOSIS — M545 Low back pain, unspecified: Secondary | ICD-10-CM | POA: Insufficient documentation

## 2017-04-05 NOTE — Progress Notes (Signed)
Location:  [place] Room Number: 12 Place of Service:  SNF (31) Provider:  Mast, Manxie  NP  Blanchie Serve, MD  Patient Care Team: Blanchie Serve, MD as PCP - General (Internal Medicine) Mast, Man X, NP as Nurse Practitioner (Internal Medicine)  Extended Emergency Contact Information Primary Emergency Contact: [facility] Address: [address] [place]          [place], [place] [address] [place] Phone: [phone] Relation: Spouse Secondary Emergency Contact: Gibson,Sarah Address: [address]          [place], [place] [address] [place] of [place] Phone: [phone] Relation: Other  Code Status:  DNR Goals of care: Advanced Directive information Advanced Directives 04/05/2017  Does Patient Have a Medical Advance Directive? Yes  Type of Advance Directive Out of facility DNR (pink MOST or yellow form)  Does patient want to make changes to medical advance directive? No - Patient declined  Pre-existing out of facility DNR order (yellow form or pink MOST form) Yellow form placed in chart (order not valid for inpatient use)     Chief Complaint  Patient presents with  . Acute Visit    Lower back pain    HPI:  Pt is a 83 y.o. male seen today for an acute visit for    Past Medical History:  Diagnosis Date  . Anemia   . Arthritis   . Hypertension    Past Surgical History:  Procedure Laterality Date  . EYE SURGERY     cataract surgery  . FRACTURE SURGERY     left hemiarthroplasty 10/15/13 Dr. Alfredia Client  . HEMORRHOID SURGERY    . JOINT REPLACEMENT     right knee replacement by Dr. Onnie Graham  . PACEMAKER PLACEMENT    . PROSTATE SURGERY      Allergies  Allergen Reactions  . Codeine   . Hctz [Hydrochlorothiazide]   . Lipitor [Atorvastatin]   . Quinine Derivatives   . Sulfa Antibiotics     Outpatient Encounter Prescriptions as of 04/05/2017  Medication Sig  . acetaminophen (TYLENOL) 325 MG tablet  Take 325 mg by mouth at bedtime as needed.   Marland Kitchen acetaminophen (TYLENOL) 325 MG tablet Take 650 mg by mouth every 6 (six) hours as needed.  Marland Kitchen acyclovir (ZOVIRAX) 400 MG tablet Take 400 mg by mouth daily.  . calcium carbonate (OSCAL) 1500 (600 Ca) MG TABS tablet Take 600 mg of elemental calcium by mouth daily.  . carboxymethylcellulose (REFRESH PLUS) 0.5 % SOLN Place 1 drop into both eyes as needed.  Marland Kitchen dexamethasone (DECADRON) 2 MG tablet Take 10 tablets (20 mg total) by mouth once a week.  . ferrous sulfate 325 (65 FE) MG EC tablet Take 325 mg by mouth daily with breakfast.  . furosemide (LASIX) 20 MG tablet Take 20 mg by mouth daily.  . furosemide (LASIX) 40 MG tablet Take 40 mg by mouth daily.  . metoprolol tartrate (LOPRESSOR) 25 MG tablet Take 12.5 mg by mouth daily.   . MULTIPLE VITAMIN PO Take 1 tablet by mouth daily.  . potassium chloride SA (K-DUR,KLOR-CON) 20 MEQ tablet Take 20 mEq by mouth daily.  Marland Kitchen senna-docusate (SENOKOT-S) 8.6-50 MG per tablet Take 1 tablet by mouth daily as needed.   . simvastatin (ZOCOR) 10 MG tablet Take 10 mg by mouth at bedtime.   Marland Kitchen warfarin (COUMADIN) 2 MG tablet Take 2 mg by mouth daily.   No facility-administered encounter medications on file as  of 04/05/2017.     Review of Systems   There is no immunization history on file for this patient. Pertinent  Health Maintenance Due  Topic Date Due  . INFLUENZA VACCINE  01/20/2017  . PNA vac Low Risk Adult (1 of 2 - PCV13) 04/22/2017 (Originally 05/24/1993)   No flowsheet data found. Functional Status Survey:    Vitals:   04/05/17 1056  BP: 118/68  Pulse: 64  Resp: (!) 24  Temp: 99 F (37.2 C)  SpO2: 96%  Weight: 133 lb (60.3 kg)  Height: 5\' 9"  (1.753 m)   Body mass index is 19.64 kg/m. Physical Exam  Labs reviewed:  Recent Labs  03/23/17 03/25/17 03/30/17  NA 138 137 138  K 4.1 3.7 4.4  BUN 23* 24* 19  CREATININE 0.7 0.7 0.7    Recent Labs  03/09/17 03/18/17  AST 21 16  ALT 29 20   ALKPHOS 79 62    Recent Labs  03/13/17 03/18/17 03/23/17  WBC 9.9 11.4  --   HGB 13.9 13.0* 12.9*  HCT 41 39* 38*  PLT 158 172 229   No results found for: TSH No results found for: HGBA1C No results found for: CHOL, HDL, LDLCALC, LDLDIRECT, TRIG, CHOLHDL  Significant Diagnostic Results in last 30 days:  No results found.  Assessment/Plan There are no diagnoses linked to this encounter.   Family/ staff Communication:   Labs/tests ordered:

## 2017-04-05 NOTE — Assessment & Plan Note (Signed)
Hx of peripheral neuropathy, lower body weakness, uses wheelchair to get around.

## 2017-04-05 NOTE — Assessment & Plan Note (Signed)
Complaining of lower back pain for 2 days following a fall, pain/dicomfort when twisting waist, otherwise no pain while sitting, lying, or standing, no lower leg weakness. Will try Biofreeze qid, may x-ray lumbar spine is no better.

## 2017-04-05 NOTE — Progress Notes (Signed)
Location:    [place] Room Number: 60 Place of Service:  SNF (31) Provider: Lennie Odor Capri Veals NP  Blanchie Serve, MD  Patient Care Team: Blanchie Serve, MD as PCP - General (Internal Medicine) Challis Crill X, NP as Nurse Practitioner (Internal Medicine)  Extended Emergency Contact Information Primary Emergency Contact: [facility] Address: [address] [place]          [place], [place] [address] [place] Phone: [phone] Relation: Spouse Secondary Emergency Contact: Gibson,Sarah Address: [address]          [place], [place] [address] Johnnette Litter of [place] Phone: [phone] Relation: Other  Code Status: DNR Goals of care: Advanced Directive information Advanced Directives 04/05/2017  Does Patient Have a Medical Advance Directive? Yes  Type of Advance Directive Out of facility DNR (pink MOST or yellow form)  Does patient want to make changes to medical advance directive? No - Patient declined  Pre-existing out of facility DNR order (yellow form or pink MOST form) Yellow form placed in chart (order not valid for inpatient use)     Chief Complaint  Patient presents with  . Acute Visit    Lower back pain    HPI:  Pt is a 83 y.o. male seen today for an acute visit for complaining lower back pain x2 days since his last fall per the patient. Pain is aching in nature, worsens with twisting waist, no pain while lying, siting, or standing, no change of lower extremities weakness or pain.    Past Medical History:  Diagnosis Date  . Anemia   . Arthritis   . Hypertension    Past Surgical History:  Procedure Laterality Date  . EYE SURGERY     cataract surgery  . FRACTURE SURGERY     left hemiarthroplasty 10/15/13 Dr. Alfredia Client  . HEMORRHOID SURGERY    . JOINT REPLACEMENT     right knee replacement by Dr. Onnie Graham  . PACEMAKER PLACEMENT    . PROSTATE SURGERY      Allergies  Allergen Reactions  . Codeine   . Hctz  [Hydrochlorothiazide]   . Lipitor [Atorvastatin]   . Quinine Derivatives   . Sulfa Antibiotics     Allergies as of 04/05/2017      Reactions   Codeine    Hctz [hydrochlorothiazide]    Lipitor [atorvastatin]    Quinine Derivatives    Sulfa Antibiotics       Medication List       Accurate as of 04/05/17  3:39 PM. Always use your most recent med list.          acetaminophen 325 MG tablet Commonly known as:  TYLENOL Take 325 mg by mouth at bedtime as needed.   acetaminophen 325 MG tablet Commonly known as:  TYLENOL Take 650 mg by mouth every 6 (six) hours as needed.   acyclovir 400 MG tablet Commonly known as:  ZOVIRAX Take 400 mg by mouth daily.   calcium carbonate 1500 (600 Ca) MG Tabs tablet Commonly known as:  OSCAL Take 600 mg of elemental calcium by mouth daily.   carboxymethylcellulose 0.5 % Soln Commonly known as:  REFRESH PLUS Place 1 drop into both eyes as needed.   dexamethasone 2 MG tablet Commonly known as:  DECADRON Take 10 tablets (20 mg total) by mouth once a week.   ferrous sulfate 325 (65 FE) MG EC tablet Take 325 mg by mouth daily with breakfast.   furosemide 20 MG  tablet Commonly known as:  LASIX Take 20 mg by mouth daily.   furosemide 40 MG tablet Commonly known as:  LASIX Take 40 mg by mouth daily.   metoprolol tartrate 25 MG tablet Commonly known as:  LOPRESSOR Take 12.5 mg by mouth daily.   MULTIPLE VITAMIN PO Take 1 tablet by mouth daily.   potassium chloride SA 20 MEQ tablet Commonly known as:  K-DUR,KLOR-CON Take 20 mEq by mouth daily.   senna-docusate 8.6-50 MG tablet Commonly known as:  Senokot-S Take 1 tablet by mouth daily as needed.   simvastatin 10 MG tablet Commonly known as:  ZOCOR Take 10 mg by mouth at bedtime.   warfarin 2 MG tablet Commonly known as:  COUMADIN Take 2 mg by mouth daily.       Review of Systems  Constitutional: Negative for activity change, appetite change, chills, diaphoresis,  fatigue and fever.  HENT: Negative for congestion.   Eyes: Negative for visual disturbance.  Respiratory: Negative for cough and chest tightness.   Cardiovascular: Negative for chest pain, palpitations and leg swelling.  Musculoskeletal: Positive for back pain and gait problem.     There is no immunization history on file for this patient. Pertinent  Health Maintenance Due  Topic Date Due  . INFLUENZA VACCINE  01/20/2017  . PNA vac Low Risk Adult (1 of 2 - PCV13) 04/22/2017 (Originally 05/24/1991)   No flowsheet data found. Functional Status Survey:    Vitals:   04/05/17 1056  BP: 118/68  Pulse: 64  Resp: (!) 24  Temp: 99 F (37.2 C)  SpO2: 96%  Weight: 133 lb (60.3 kg)  Height: 5\' 9"  (1.753 m)   Body mass index is 19.64 kg/m. Physical Exam  Constitutional: He appears well-developed and well-nourished.  Cardiovascular: Normal rate.   Murmur heard. Pulmonary/Chest: Effort normal and breath sounds normal. He has no wheezes.  Musculoskeletal: Normal range of motion. He exhibits tenderness. He exhibits no edema.  Lower back pain/discomfort when twisting waist.     Labs reviewed:  Recent Labs  03/23/17 03/25/17 03/30/17  NA 138 137 138  K 4.1 3.7 4.4  BUN 23* 24* 19  CREATININE 0.7 0.7 0.7    Recent Labs  03/09/17 03/18/17  AST 21 16  ALT 29 20  ALKPHOS 79 62    Recent Labs  03/13/17 03/18/17 03/23/17  WBC 9.9 11.4  --   HGB 13.9 13.0* 12.9*  HCT 41 39* 38*  PLT 158 172 229   No results found for: TSH No results found for: HGBA1C No results found for: CHOL, HDL, LDLCALC, LDLDIRECT, TRIG, CHOLHDL  Significant Diagnostic Results in last 30 days:  No results found.  Assessment/Plan: Lower back pain Complaining of lower back pain for 2 days following a fall, pain/[place] when twisting waist, otherwise no pain while sitting, lying, or standing, no lower leg weakness. Will try Biofreeze qid, may x-ray lumbar spine is no better.   Peripheral  neuropathy Hx of peripheral neuropathy, lower body weakness, uses wheelchair to get around.     Family/ staff Communication: plan of care reviewed with the patient and charge nurse.   Labs/tests ordered:  None  Time spend 25 minutes.

## 2017-04-13 ENCOUNTER — Encounter: Payer: Self-pay | Admitting: Nurse Practitioner

## 2017-04-13 ENCOUNTER — Non-Acute Institutional Stay (SKILLED_NURSING_FACILITY): Payer: Medicare Other | Admitting: Nurse Practitioner

## 2017-04-13 DIAGNOSIS — D4989 Neoplasm of unspecified behavior of other specified sites: Secondary | ICD-10-CM | POA: Diagnosis not present

## 2017-04-13 DIAGNOSIS — D62 Acute posthemorrhagic anemia: Secondary | ICD-10-CM | POA: Diagnosis not present

## 2017-04-13 DIAGNOSIS — I480 Paroxysmal atrial fibrillation: Secondary | ICD-10-CM | POA: Diagnosis not present

## 2017-04-13 DIAGNOSIS — Z5181 Encounter for therapeutic drug level monitoring: Secondary | ICD-10-CM | POA: Diagnosis not present

## 2017-04-13 DIAGNOSIS — I509 Heart failure, unspecified: Secondary | ICD-10-CM | POA: Diagnosis not present

## 2017-04-13 DIAGNOSIS — M545 Low back pain: Secondary | ICD-10-CM

## 2017-04-13 DIAGNOSIS — Z7901 Long term (current) use of anticoagulants: Secondary | ICD-10-CM

## 2017-04-13 LAB — PROTIME-INR: Protime: 36.1 — AB (ref 10.0–13.8)

## 2017-04-13 LAB — POCT INR: INR: 3.4 — AB (ref <=1.1)

## 2017-04-13 NOTE — Progress Notes (Signed)
Location:  [place] Room Number: 61 Place of Service:  SNF (31) Provider:  Shayanna Thatch, Manxie  NP  Blanchie Serve, MD  Patient Care Team: Blanchie Serve, MD as PCP - General (Internal Medicine) Jaymarion Trombly X, NP as Nurse Practitioner (Internal Medicine)  Extended Emergency Contact Information Primary Emergency Contact: [facility] Address: [address] [place]          [place], [place] [address] [place] Phone: [phone] Relation: Spouse Secondary Emergency Contact: Gibson,Sarah Address: [address]          [place], [place] [address] Johnnette Litter of [place] Phone: [phone] Relation: Other  Code Status:  DNR Goals of care: Advanced Directive information Advanced Directives 04/13/2017  Does Patient Have a Medical Advance Directive? Yes  Type of Advance Directive Out of facility DNR (pink MOST or yellow form)  Does patient want to make changes to medical advance directive? No - Patient declined  Pre-existing out of facility DNR order (yellow form or pink MOST form) Yellow form placed in chart (order not valid for inpatient use)     Chief Complaint  Patient presents with  . Medical Management of Chronic Issues    HPI:  Pt is a 83 y.o. male seen today for medical management of chronic diseases.     Hx of Afib, DVTx2 , heart rate is controlled, pacemaker,  taking Metoprolol 12.52m qd, currently on Coumadin chronically, on hold due to supra therapeutic INR 3.4, pending PT/INR, no sign of bleeding. CHF; compensated, taking Furosemide 442m7 am and 2059mpm since 03/29/17. Weights: # 142, 141, 139, 132, 133, 137, 140, 133, 139, 139, 137, 134, 136. He takes Fe 325m18mily, last Hgb 12.9 03/23/17. Multiple myeloma: f/u oncology, chemo 04/14/17, chronic lower back pain, livable, long standing issue, spinal synovial cyst, pain travels to the left buttock/thigh which is positional.   Past Medical History:  Diagnosis Date  . Anemia     . Arthritis   . Hypertension    Past Surgical History:  Procedure Laterality Date  . EYE SURGERY     cataract surgery  . FRACTURE SURGERY     left hemiarthroplasty 10/15/13 Dr. KennAlfredia ClientHEMORRHOID SURGERY    . JOINT REPLACEMENT     right knee replacement by Dr. WellOnnie GrahamPACEMAKER PLACEMENT    . PROSTATE SURGERY      Allergies  Allergen Reactions  . Codeine   . Hctz [Hydrochlorothiazide]   . Lipitor [Atorvastatin]   . Quinine Derivatives   . Sulfa Antibiotics     Outpatient Encounter Prescriptions as of 04/13/2017  Medication Sig  . acetaminophen (TYLENOL) 325 MG tablet Take 325 mg by mouth at bedtime as needed.   . acMarland Kitchentaminophen (TYLENOL) 325 MG tablet Take 650 mg by mouth every 6 (six) hours as needed.  . acMarland Kitchenclovir (ZOVIRAX) 400 MG tablet Take 400 mg by mouth daily.  . calcium carbonate (OSCAL) 1500 (600 Ca) MG TABS tablet Take 600 mg of elemental calcium by mouth daily.  . carboxymethylcellulose (REFRESH PLUS) 0.5 % SOLN Place 1 drop into both eyes as needed.  . deMarland Kitchenamethasone (DECADRON) 2 MG tablet Take 10 tablets (20 mg total) by mouth once a week.  . ferrous sulfate 325 (65 FE) MG EC tablet Take 325 mg by mouth daily with breakfast.  . furosemide (LASIX) 20 MG tablet Take 20 mg by mouth daily.  . furosemide (LASIX) 40 MG tablet Take 40 mg by  mouth daily.  . metoprolol tartrate (LOPRESSOR) 25 MG tablet Take 12.5 mg by mouth daily.   . MULTIPLE VITAMIN PO Take 1 tablet by mouth daily.  . potassium chloride SA (K-DUR,KLOR-CON) 20 MEQ tablet Take 20 mEq by mouth daily.  Marland Kitchen senna-docusate (SENOKOT-S) 8.6-50 MG per tablet Take 1 tablet by mouth daily as needed.   . simvastatin (ZOCOR) 10 MG tablet Take 10 mg by mouth at bedtime.   Marland Kitchen warfarin (COUMADIN) 2 MG tablet Take 2 mg by mouth daily.   No facility-administered encounter medications on file as of 04/13/2017.     Review of Systems  Constitutional: Negative for activity change, appetite change, chills,  diaphoresis and fatigue.  HENT: Negative for congestion and nosebleeds.   Eyes: Negative for visual disturbance.  Respiratory: Negative for cough, choking, chest tightness, shortness of breath and wheezing.   Cardiovascular: Negative for chest pain, palpitations and leg swelling.  Gastrointestinal: Negative for abdominal distention, abdominal pain, anal bleeding and blood in stool.  Genitourinary: Negative for difficulty urinating and hematuria.  Musculoskeletal: Positive for back pain and gait problem.  Skin: Negative for color change, pallor, rash and wound.  Neurological: Negative for dizziness, speech difficulty, numbness and headaches.  Psychiatric/Behavioral: Negative for agitation, behavioral problems, confusion and sleep disturbance. The patient is not nervous/anxious.      There is no immunization history on file for this patient. Pertinent  Health Maintenance Due  Topic Date Due  . INFLUENZA VACCINE  01/20/2017  . PNA vac Low Risk Adult (1 of 2 - PCV13) 04/22/2017 (Originally 05/24/1991)   No flowsheet data found. Functional Status Survey:    Vitals:   04/13/17 1512  BP: 132/76  Pulse: 66  Resp: 20  Temp: 98 F (36.7 C)  SpO2: 98%  Weight: 141 lb (64 kg)  Height: 5' 9"  (1.753 m)   Body mass index is 20.82 kg/m. Physical Exam  Constitutional: He appears well-developed and well-nourished. No distress.  HENT:  Head: Normocephalic and atraumatic.  Eyes: Pupils are equal, round, and reactive to light. Conjunctivae and EOM are normal.  Neck: Normal range of motion. Neck supple. No JVD present. No thyromegaly present.  Cardiovascular: Normal rate and regular rhythm.   Murmur heard. Left upper chest pacemaker  Pulmonary/Chest: Effort normal and breath sounds normal. He has no wheezes. He has no rales.  Abdominal: Soft. Bowel sounds are normal. He exhibits no distension. There is no tenderness. There is no rebound and no guarding.  Musculoskeletal: Normal range of  motion. He exhibits tenderness. He exhibits no edema.  Lower back pain  Neurological: He is alert. No cranial nerve deficit. Coordination normal.  Oriented to person and place.   Skin: Skin is warm and dry. No rash noted. He is not diaphoretic. No erythema. No pallor.  Psychiatric: He has a normal mood and affect. His behavior is normal.    Labs reviewed:  Recent Labs  03/23/17 03/25/17 03/30/17  NA 138 137 138  K 4.1 3.7 4.4  BUN 23* 24* 19  CREATININE 0.7 0.7 0.7    Recent Labs  03/09/17 03/18/17  AST 21 16  ALT 29 20  ALKPHOS 79 62    Recent Labs  03/13/17 03/18/17 03/23/17  WBC 9.9 11.4  --   HGB 13.9 13.0* 12.9*  HCT 41 39* 38*  PLT 158 172 229   No results found for: TSH No results found for: HGBA1C No results found for: CHOL, HDL, LDLCALC, LDLDIRECT, TRIG, CHOLHDL  Significant Diagnostic Results  in last 30 days:  No results found.  Assessment/Plan Anticoagulation goal of INR 2 to 3 Hx of Afib, DVTx2 , currently on Coumadin chronically, on hold due to supra therapeutic INR 3.4, pending PT/INR, no sign of bleeding.    A-fib Heart rate is in control, continue Metoprolol 12.86m qd  CHF (congestive heart failure) ([place] compensated, taking Furosemide 469m7 am and 2097mpm since 03/29/17. Weights: # Ibs 142, 141, 139, 132, 133, 137, 140, 133, 139, 139, 137, 134, 136  Postoperative anemia due to acute blood loss He takes Fe 325m67mily, last Hgb 12.9 03/23/17.    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 SPEP/IFE and clinical parameters for myeloma diagnosis is recommended for further classification.  11/16/13 Oncology Dr.George SandBaird Cancerloma.  Continue Decadron, f/u Oncology.   Lower back pain chronic lower back pain, livable, long standing issue, spinal synovial cyst, pain travels to the left buttock/thigh which is positional.       Family/ staff  Communication: plan of care reviewed with the patient and charge nurse  Labs/tests ordered:  PT/INR  Time spend 25 minutes.

## 2017-04-14 NOTE — Assessment & Plan Note (Signed)
10/23/13 biopsy left femoral head: plasma cell neoplasm, extensive involvement(comprising greater than 90% of the cellularity). SPEP/IFE and clinical parameters for myeloma diagnosis is recommended for further classification.  11/16/13 Oncology Dr.George Baird Cancer Myeloma.  Continue Decadron, f/u Oncology.

## 2017-04-14 NOTE — Assessment & Plan Note (Signed)
chronic lower back pain, livable, long standing issue, spinal synovial cyst, pain travels to the left buttock/thigh which is positional.

## 2017-04-14 NOTE — Assessment & Plan Note (Signed)
compensated, taking Furosemide 40mg  7 am and 20mg  2pm since 03/29/17. Weights: # Ibs 142, 141, 139, 132, 133, 137, 140, 133, 139, 139, 137, 134, 136

## 2017-04-14 NOTE — Assessment & Plan Note (Signed)
Hx of Afib, DVTx2 , currently on Coumadin chronically, on hold due to supra therapeutic INR 3.4, pending PT/INR, no sign of bleeding.

## 2017-04-14 NOTE — Assessment & Plan Note (Signed)
Heart rate is in control, continue Metoprolol 12.5mg qd.  

## 2017-04-14 NOTE — Assessment & Plan Note (Addendum)
He takes Fe 325mg  daily, last Hgb 12.9 03/23/17.

## 2017-04-20 ENCOUNTER — Non-Acute Institutional Stay (SKILLED_NURSING_FACILITY): Payer: Medicare Other | Admitting: Nurse Practitioner

## 2017-04-20 ENCOUNTER — Encounter: Payer: Self-pay | Admitting: Nurse Practitioner

## 2017-04-20 DIAGNOSIS — I480 Paroxysmal atrial fibrillation: Secondary | ICD-10-CM

## 2017-04-20 DIAGNOSIS — R079 Chest pain, unspecified: Secondary | ICD-10-CM | POA: Diagnosis not present

## 2017-04-20 DIAGNOSIS — K219 Gastro-esophageal reflux disease without esophagitis: Secondary | ICD-10-CM | POA: Diagnosis not present

## 2017-04-20 NOTE — Assessment & Plan Note (Addendum)
History of GERD, add Omeprazole 20mg  daily x 2 weeks, observe. EKG to rule out cardiac related c/o " mid chest discomfort".

## 2017-04-20 NOTE — Assessment & Plan Note (Signed)
Heart rate is in control, continue Metoprolol 12.5mg qd.  

## 2017-04-20 NOTE — Progress Notes (Addendum)
Location:  [facility] Room Number: 56 Place of Service:  SNF (31) Provider:  Camber Ninh, Manxie  NP  Blanchie Serve, MD  Patient Care Team: Blanchie Serve, MD as PCP - General (Internal Medicine) Elliyah Liszewski X, NP as Nurse Practitioner (Internal Medicine)  Extended Emergency Contact Information Primary Emergency Contact: [facility] Address: [address] [place]          [place], [place] [address] [place] Phone: [phone] Relation: Spouse Secondary Emergency Contact: Gibson,Sarah Address: [address]          [place], [place] [address] [place]net[place]itter of [place] Phone: [phone] Relation: Other  Code Status:  DNR Goals of care: Advanced Directive information Advanced Directives 04/20/2017  Does Patient Have a Medical Advance Directive? Yes  Type of Advance Directive Out of facility DNR (pink MOST or yellow form)  Does patient want to make changes to medical advance directive? No - Patient declined  Pre-existing out of facility DNR order (yellow form or pink MOST form) Yellow form placed in chart (order not valid for inpatient use)     Chief Complaint  Patient presents with  . Acute Visit    mid - chest discomfort    HPI:  Pt is a 83 y.o. male seen today for an acute visit for the patient's wife reported the patient's left chest discomfort about 15 minutes each, total 3x, last Thr, Fri, today at cancer center(10/25, 10/26, 10/30). Similar presentations: sudden onset, heartburn like in nature, not radiating, no sense of impending doom, no cough, sputum production, nausea, vomiting, palpitation,  dizziness, SOB, focal weakness, or near faint, resolved with no intervention, but he did have EKG at cancer center, had NTG after the event. He has an appointment with Cardiology 04/22/17. The patient admitted some indigestion and heartburns may contributory. He takes Fe an Kcl daily which may have irritated GI, but no nausea, vomiting, or  abd pain noted. Hx of GERD, not on acid reducer. Hx of Afib, has pace maker, heart rate is in control, taking Metoprolol 12.5mg  daily, Coumadin for thromboembolic risk reduction.    Past Medical History:  Diagnosis Date  . Anemia   . Arthritis   . Hypertension    Past Surgical History:  Procedure Laterality Date  . EYE SURGERY     cataract surgery  . FRACTURE SURGERY     left hemiarthroplasty 10/15/13 Dr. Alfredia Client  . HEMORRHOID SURGERY    . JOINT REPLACEMENT     right knee replacement by Dr. Onnie Graham  . PACEMAKER PLACEMENT    . PROSTATE SURGERY      Allergies  Allergen Reactions  . Codeine   . Hctz [Hydrochlorothiazide]   . Lipitor [Atorvastatin]   . Quinine Derivatives   . Sulfa Antibiotics     Outpatient Encounter Prescriptions as of 04/20/2017  Medication Sig  . acetaminophen (TYLENOL) 325 MG tablet Take 325 mg by mouth at bedtime as needed.   Marland Kitchen acetaminophen (TYLENOL) 325 MG tablet Take 650 mg by mouth every 6 (six) hours as needed.  Marland Kitchen acyclovir (ZOVIRAX) 400 MG tablet Take 400 mg by mouth daily.  . calcium carbonate (OSCAL) 1500 (600 Ca) MG TABS tablet Take 600 mg of elemental calcium by mouth daily.  . carboxymethylcellulose (REFRESH PLUS) 0.5 % SOLN Place 1 drop into both eyes as needed.  Marland Kitchen dexamethasone (DECADRON) 2 MG tablet Take 10 tablets (20 mg total) by mouth once a week.  . ferrous sulfate 325 (  65 FE) MG EC tablet Take 325 mg by mouth daily with breakfast.  . furosemide (LASIX) 20 MG tablet Take 20 mg by mouth daily.  . furosemide (LASIX) 40 MG tablet Take 40 mg by mouth daily.  . metoprolol tartrate (LOPRESSOR) 25 MG tablet Take 12.5 mg by mouth daily.   . MULTIPLE VITAMIN PO Take 1 tablet by mouth daily.  . potassium chloride SA (K-DUR,KLOR-CON) 20 MEQ tablet Take 20 mEq by mouth daily.  Marland Kitchen senna-docusate (SENOKOT-S) 8.6-50 MG per tablet Take 1 tablet by mouth daily as needed.   . simvastatin (ZOCOR) 10 MG tablet Take 10 mg by mouth at bedtime.   Marland Kitchen  warfarin (COUMADIN) 2 MG tablet Take 2 mg by mouth daily.   No facility-administered encounter medications on file as of 04/20/2017.     Review of Systems  Constitutional: Negative for activity change, appetite change, chills, diaphoresis, fatigue and fever.  HENT: Negative for congestion, trouble swallowing and voice change.   Respiratory: Negative for cough, choking, chest tightness, shortness of breath and wheezing.   Cardiovascular: Negative for palpitations and leg swelling.       Left chest discomfort about 15 minutes each, total 3x, last Thr, Fri, today at cancer center(10/25, 10/26, 10/30). Similar presentations: sudden onset, heartburn like in nature, not radiating, no sense of impending doom, no nausea, vomiting, dizziness, SOB, or near faint, resolved with no intervention, but he did have EKG at cancer center, had NTG after the event. He has an appointment with Cardiology 04/22/17.   Gastrointestinal: Negative for abdominal distention, abdominal pain, constipation, diarrhea, nausea and vomiting.  Musculoskeletal: Positive for gait problem.  Skin: Negative for color change, pallor and rash.  Psychiatric/Behavioral: Negative for confusion. The patient is not nervous/anxious.      There is no immunization history on file for this patient. Pertinent  Health Maintenance Due  Topic Date Due  . INFLUENZA VACCINE  01/20/2017  . PNA vac Low Risk Adult (1 of 2 - PCV13) 04/22/2017 (Originally 05/24/1991)   No flowsheet data found. Functional Status Survey:    Vitals:   04/20/17 1215  BP: 120/70  Pulse: 66  Resp: 20  Temp: (!) 97.1 F (36.2 C)  SpO2: 95%  Weight: 131 lb 14.4 oz (59.8 kg)  Height: 5\' 9"  (1.753 m)   Body mass index is 19.48 kg/m. Physical Exam  Constitutional: He is oriented to person, place, and time. He appears well-developed and well-nourished. No distress.  HENT:  Head: Normocephalic and atraumatic.  Eyes: Pupils are equal, round, and reactive to light.  Conjunctivae and EOM are normal.  Neck: Normal range of motion. Neck supple. No JVD present. No thyromegaly present.  Cardiovascular: Normal rate and regular rhythm.   Murmur heard. Pulmonary/Chest: Effort normal and breath sounds normal. He has no wheezes. He has no rales.  Abdominal: Soft. Bowel sounds are normal. He exhibits no distension. There is no tenderness. There is no rebound.  Neurological: He is alert and oriented to person, place, and time.  Skin: He is not diaphoretic.    Labs reviewed:  Recent Labs  03/23/17 03/25/17 03/30/17  NA 138 137 138  K 4.1 3.7 4.4  BUN 23* 24* 19  CREATININE 0.7 0.7 0.7    Recent Labs  03/09/17 03/18/17  AST 21 16  ALT 29 20  ALKPHOS 79 62    Recent Labs  03/13/17 03/18/17 03/23/17  WBC 9.9 11.4  --   HGB 13.9 13.0* 12.9*  HCT 41 39* 38*  PLT 158 172 229   No results found for: TSH No results found for: HGBA1C No results found for: CHOL, HDL, LDLCALC, LDLDIRECT, TRIG, CHOLHDL  Significant Diagnostic Results in last 30 days:  No results found.  Assessment/Plan GERD (gastroesophageal reflux disease) History of GERD, add Omeprazole 20mg  daily x 2 weeks, observe. EKG to rule out cardiac related c/o " mid chest discomfort".   A-fib Heart rate is in control, continue Metoprolol 12.5mg  qd  Chest pain The patient presently has no chest pain, will obtain EKG to evaluate further, prn NTG available to him. Adding Omeprazole to treat possible GERD. Observe.      Family/ staff Communication: plan of care reviewed with the patient and charge nurse  Labs/tests ordered:  EKG  Time spend 25 minutes.

## 2017-04-21 ENCOUNTER — Emergency Department (HOSPITAL_BASED_OUTPATIENT_CLINIC_OR_DEPARTMENT_OTHER): Payer: Medicare Other

## 2017-04-21 ENCOUNTER — Encounter: Payer: Self-pay | Admitting: Nurse Practitioner

## 2017-04-21 ENCOUNTER — Encounter (HOSPITAL_BASED_OUTPATIENT_CLINIC_OR_DEPARTMENT_OTHER): Payer: Self-pay | Admitting: Emergency Medicine

## 2017-04-21 ENCOUNTER — Emergency Department (HOSPITAL_BASED_OUTPATIENT_CLINIC_OR_DEPARTMENT_OTHER)
Admission: EM | Admit: 2017-04-21 | Discharge: 2017-04-21 | Disposition: A | Discharge status: Home / Self Care | Payer: Medicare Other | Attending: Emergency Medicine | Admitting: Emergency Medicine

## 2017-04-21 DIAGNOSIS — I509 Heart failure, unspecified: Secondary | ICD-10-CM | POA: Insufficient documentation

## 2017-04-21 DIAGNOSIS — R079 Chest pain, unspecified: Secondary | ICD-10-CM | POA: Diagnosis present

## 2017-04-21 DIAGNOSIS — Z7901 Long term (current) use of anticoagulants: Secondary | ICD-10-CM | POA: Insufficient documentation

## 2017-04-21 DIAGNOSIS — I110 Hypertensive heart disease with heart failure: Secondary | ICD-10-CM | POA: Diagnosis not present

## 2017-04-21 DIAGNOSIS — Z8546 Personal history of malignant neoplasm of prostate: Secondary | ICD-10-CM | POA: Diagnosis not present

## 2017-04-21 DIAGNOSIS — E785 Hyperlipidemia, unspecified: Secondary | ICD-10-CM | POA: Diagnosis not present

## 2017-04-21 DIAGNOSIS — Z950 Presence of cardiac pacemaker: Secondary | ICD-10-CM | POA: Insufficient documentation

## 2017-04-21 DIAGNOSIS — R0789 Other chest pain: Secondary | ICD-10-CM | POA: Insufficient documentation

## 2017-04-21 DIAGNOSIS — Z86718 Personal history of other venous thrombosis and embolism: Secondary | ICD-10-CM | POA: Insufficient documentation

## 2017-04-21 DIAGNOSIS — Z79899 Other long term (current) drug therapy: Secondary | ICD-10-CM | POA: Diagnosis not present

## 2017-04-21 DIAGNOSIS — I4891 Unspecified atrial fibrillation: Secondary | ICD-10-CM | POA: Insufficient documentation

## 2017-04-21 HISTORY — DX: Malignant neoplasm of prostate: C61

## 2017-04-21 HISTORY — DX: Other amnesia: R41.3

## 2017-04-21 HISTORY — DX: Hypo-osmolality and hyponatremia: E87.1

## 2017-04-21 HISTORY — DX: Dysphagia, unspecified: R13.10

## 2017-04-21 HISTORY — DX: Long term (current) use of anticoagulants: Z79.01

## 2017-04-21 HISTORY — DX: Chronic embolism and thrombosis of unspecified deep veins of unspecified proximal lower extremity: I82.5Y9

## 2017-04-21 HISTORY — DX: Hyperlipidemia, unspecified: E78.5

## 2017-04-21 HISTORY — DX: Bandemia: D72.825

## 2017-04-21 HISTORY — DX: Benign neoplasm of colon, unspecified: D12.6

## 2017-04-21 HISTORY — DX: Presence of cardiac pacemaker: Z95.0

## 2017-04-21 HISTORY — DX: Multiple myeloma in remission: C90.01

## 2017-04-21 HISTORY — DX: Hyperglycemia, unspecified: R73.9

## 2017-04-21 HISTORY — DX: Unilateral post-traumatic osteoarthritis, left hip: M16.52

## 2017-04-21 HISTORY — DX: Corns and callosities: L84

## 2017-04-21 HISTORY — DX: Unspecified atrial fibrillation: I48.91

## 2017-04-21 LAB — BASIC METABOLIC PANEL
Anion gap: 7 (ref 5–15)
BUN: 28 mg/dL — ABNORMAL HIGH (ref 6–20)
CHLORIDE: 102 mmol/L (ref 101–111)
CO2: 29 mmol/L (ref 22–32)
CREATININE: 0.86 mg/dL (ref 0.61–1.24)
Calcium: 8.9 mg/dL (ref 8.9–10.3)
Glucose, Bld: 110 mg/dL — ABNORMAL HIGH (ref 65–99)
POTASSIUM: 3.7 mmol/L (ref 3.5–5.1)
SODIUM: 138 mmol/L (ref 135–145)

## 2017-04-21 LAB — CBC WITH DIFFERENTIAL/PLATELET
BASOS ABS: 0 10*3/uL (ref 0.0–0.1)
BASOS PCT: 0 %
EOS ABS: 0 10*3/uL (ref 0.0–0.7)
Eosinophils Relative: 0 %
HEMATOCRIT: 40.2 % (ref 39.0–52.0)
Hemoglobin: 12.9 g/dL — ABNORMAL LOW (ref 13.0–17.0)
Lymphocytes Relative: 9 %
Lymphs Abs: 1.1 10*3/uL (ref 0.7–4.0)
MCH: 29.7 pg (ref 26.0–34.0)
MCHC: 32.1 g/dL (ref 30.0–36.0)
MCV: 92.4 fL (ref 78.0–100.0)
MONO ABS: 2 10*3/uL — AB (ref 0.1–1.0)
Monocytes Relative: 15 %
NEUTROS ABS: 10 10*3/uL — AB (ref 1.7–7.7)
NEUTROS PCT: 76 %
Platelets: 191 10*3/uL (ref 150–400)
RBC: 4.35 MIL/uL (ref 4.22–5.81)
RDW: 15.1 % (ref 11.5–15.5)
WBC: 13.2 10*3/uL — ABNORMAL HIGH (ref 4.0–10.5)

## 2017-04-21 LAB — PROTIME-INR
INR: 2.46
Prothrombin Time: 26.5 seconds — ABNORMAL HIGH (ref 11.4–15.2)

## 2017-04-21 LAB — CBG MONITORING, ED: Glucose-Capillary: 112 mg/dL — ABNORMAL HIGH (ref 65–99)

## 2017-04-21 LAB — TROPONIN I
TROPONIN I: 0.04 ng/mL — AB (ref <0.03)
Troponin I: 0.04 ng/mL (ref <0.03)

## 2017-04-21 MED ORDER — ASPIRIN 81 MG PO CHEW
324.0000 mg | CHEWABLE_TABLET | Freq: Once | ORAL | Status: AC
  Administered 2017-04-21: 324 mg via ORAL
  Filled 2017-04-21: qty 4

## 2017-04-21 NOTE — Discharge Instructions (Signed)
Follow up with your Cardiologist tomorrow.

## 2017-04-21 NOTE — ED Provider Notes (Signed)
[facility] EMERGENCY DEPARTMENT Provider Note   CSN: [id number] Arrival date & time: 04/21/17  1030     History   Chief Complaint Chief Complaint  Patient presents with  . Abnormal ECG    HPI Derrick Levy is a 83 y.o. male.  83 yo M with a chief complaint of chest pain.  Describes this is a pressure in his left lateral in location.  Nothing seems to make this better or worse.  It started spontaneously and then resolves about 15 minutes later on average.  He denies hemoptysis denies shortness of breath.  Denies diaphoresis nausea or vomiting.  Patient has had this off and on for the past week or so.  Has seen the provider at his nursing home multiple times.  He has a follow-up appointment tomorrow with the cardiologist.  He was sent from the nursing home for unknown reasons.  Patient states it was because his pain was so severe.  EMS was unsure of the reason for transfer today.    The history is provided by the patient.  Chest Pain   This is a new problem. The current episode started more than 1 week ago. The problem occurs daily. The problem has been resolved. The pain is present in the lateral region. The pain is at a severity of 10/10. The pain is severe. The quality of the pain is described as brief. The pain does not radiate. Duration of episode(s) is 15 minutes. The symptoms are aggravated by certain positions. Pertinent negatives include no abdominal pain, no fever, no headaches, no palpitations, no shortness of breath and no vomiting. He has tried nothing for the symptoms. The treatment provided no relief.  His past medical history is significant for DVT.  Pertinent negatives for past medical history include no MI and no PE.    Past Medical History:  Diagnosis Date  . A-fib ([place])   . Anemia   . Arthritis   . Bandemia   . Benign neoplasm of colon   . Blood glucose elevated   . 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 Corns and callosities    . Dysphagia   . Hyperlipemia   . Hypertension   . Hyposmolality syndrome   . Long term (current) use of anticoagulants   . Malignant neoplasm of prostate ([place])   . Memory loss   . Multiple myeloma in remission ([place])   . Pacemaker   . Post-traumatic osteoarthritis of left hip     Patient Active Problem List   Diagnosis Date Noted  . Chest pain 04/21/2017  . Lower back pain 04/05/2017  . CHF (congestive heart failure) ([place])   . 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 Cardiac pacemaker in situ 10/26/2013  . DVT (deep venous thrombosis) ([place]) 10/26/2013  . Peripheral neuropathy 10/26/2013  . Anticoagulation goal of INR 2 to 3 10/26/2013  . Postoperative anemia due to acute blood loss 10/25/2013  . Wheezes 10/25/2013  . Hyponatremia 10/23/2013  . Pulmonary edema 10/23/2013  . Left hip pain 10/23/2013  . HTN (hypertension) 10/23/2013  . A-fib ([place]) 10/23/2013  . HOH (hard of hearing) 10/23/2013  . GERD (gastroesophageal reflux disease) 10/23/2013  . Unspecified constipation 10/23/2013    Past Surgical History:  Procedure Laterality Date  . EYE SURGERY     cataract surgery  . FRACTURE SURGERY     left hemiarthroplasty 10/15/13 Dr. Alfredia Client  . HEMORRHOID SURGERY    . JOINT REPLACEMENT  right knee replacement by Dr. Onnie Graham  . PACEMAKER PLACEMENT    . PROSTATE SURGERY         Home Medications    Prior to Admission medications   Medication Sig Start Date End Date Taking? Authorizing Provider  acetaminophen (TYLENOL) 325 MG tablet Take 325 mg by mouth at bedtime as needed.     [provider]  acetaminophen (TYLENOL) 325 MG tablet Take 650 mg by mouth every 6 (six) hours as needed.    [provider]  acyclovir (ZOVIRAX) 400 MG tablet Take 400 mg by mouth daily.    [provider]  calcium carbonate (OSCAL) 1500 (600 Ca) MG TABS tablet Take 600 mg of elemental calcium by mouth daily.    [provider]  carboxymethylcellulose  (REFRESH PLUS) 0.5 % SOLN Place 1 drop into both eyes as needed.    [provider]  dexamethasone (DECADRON) 2 MG tablet Take 10 tablets (20 mg total) by mouth once a week. 11/20/13   Mast, Man X, NP  ferrous sulfate 325 (65 FE) MG EC tablet Take 325 mg by mouth daily with breakfast.    [provider]  furosemide (LASIX) 20 MG tablet Take 20 mg by mouth daily.    [provider]  furosemide (LASIX) 40 MG tablet Take 40 mg by mouth daily.    [provider]  metoprolol tartrate (LOPRESSOR) 25 MG tablet Take 12.5 mg by mouth daily.     [provider]  MULTIPLE VITAMIN PO Take 1 tablet by mouth daily.    [provider]  potassium chloride SA (K-DUR,KLOR-CON) 20 MEQ tablet Take 20 mEq by mouth daily.    [provider]  senna-docusate (SENOKOT-S) 8.6-50 MG per tablet Take 1 tablet by mouth daily as needed.     [provider]  simvastatin (ZOCOR) 10 MG tablet Take 10 mg by mouth at bedtime.     [provider]  warfarin (COUMADIN) 2 MG tablet Take 2 mg by mouth daily.    [provider]    Family History Family History  Problem Relation Age of Onset  . Hypertension Unknown   . Bladder Cancer Unknown   . Prostate cancer Unknown     Social History Social History  Substance Use Topics  . Smoking status: Never Smoker  . Smokeless tobacco: Never Used  . Alcohol use No     Allergies   Codeine; Hctz [hydrochlorothiazide]; Lipitor [atorvastatin]; Quinine derivatives; and Sulfa antibiotics   Review of Systems Review of Systems  Constitutional: Negative for chills and fever.  HENT: Negative for congestion and facial swelling.   Eyes: Negative for discharge and visual disturbance.  Respiratory: Negative for shortness of breath.   Cardiovascular: Positive for chest pain. Negative for palpitations.  Gastrointestinal: Negative for abdominal pain, diarrhea and vomiting.  Musculoskeletal: Negative for  arthralgias and myalgias.  Skin: Negative for color change and rash.  Neurological: Negative for tremors, syncope and headaches.  Psychiatric/Behavioral: Negative for confusion and dysphoric mood.     Physical Exam Updated Vital Signs BP 125/74   Pulse 65   Temp 97.9 F (36.6 C) (Axillary) Comment: pt sts usually unable to get oral temp reading   Resp 13   SpO2 98%   Physical Exam  Constitutional: He is oriented to person, place, and time. He appears well-developed and well-nourished.  HENT:  Head: Normocephalic and atraumatic.  Eyes: Pupils are equal, round, and reactive to light. EOM are normal.  Neck:  Normal range of motion. Neck supple. No JVD present.  Cardiovascular: Normal rate and regular rhythm.  Exam reveals no gallop and no friction rub.   No murmur heard. Pulmonary/Chest: No respiratory distress. He has no wheezes. He exhibits tenderness.  Pain over the pocket of the ICD.  Reproduces his symptoms.  Abdominal: He exhibits no distension and no mass. There is no tenderness. There is no rebound and no guarding.  Musculoskeletal: Normal range of motion.  Neurological: He is alert and oriented to person, place, and time.  Skin: No rash noted. No pallor.  Psychiatric: He has a normal mood and affect. His behavior is normal.  Nursing note and vitals reviewed.    ED Treatments / Results  Labs (all labs ordered are listed, but only abnormal results are displayed) Labs Reviewed  BASIC METABOLIC PANEL - Abnormal; Notable for the following:       Result Value   Glucose, Bld 110 (*)    BUN 28 (*)    All other components within normal limits  TROPONIN I - Abnormal; Notable for the following:    Troponin I 0.04 (*)    All other components within normal limits  TROPONIN I - Abnormal; Notable for the following:    Troponin I 0.04 (*)    All other components within normal limits  CBC WITH DIFFERENTIAL/PLATELET - Abnormal; Notable for the following:    WBC 13.2 (*)     Hemoglobin 12.9 (*)    Neutro Abs 10.0 (*)    Monocytes Absolute 2.0 (*)    All other components within normal limits  PROTIME-INR - Abnormal; Notable for the following:    Prothrombin Time 26.5 (*)    All other components within normal limits  CBG MONITORING, ED - Abnormal; Notable for the following:    Glucose-Capillary 112 (*)    All other components within normal limits    EKG  EKG Interpretation  Date/Time:  Wednesday April 21 2017 10:40:23 EDT Ventricular Rate:  65 PR Interval:    QRS Duration: 154 QT Interval:  501 QTC Calculation: 521 R Axis:   -88 Text Interpretation:  Afib/flutter and ventricular-paced rhythm No further analysis attempted due to paced rhythm No old tracing to compare Confirmed by Deno Etienne [phone]) on 04/21/2017 11:07:50 AM       Radiology Dg Chest 2 View  Result Date: 04/21/2017 CLINICAL DATA:  Left-sided chest pains noted to be greater at night and in the morning. History of atrial fibrillation, CHF with pulmonary edema, prostate malignancy EXAM: CHEST  2 VIEW COMPARISON:  None in PACs FINDINGS: The lungs are well-expanded with mild hemidiaphragm flattening. There are small bilateral pleural effusions blunting the costophrenic angles. The heart is normal in size. The pulmonary vascularity is not engorged. The ICD is in stable position. There is mild multilevel degenerative disc disease of the thoracic spine. IMPRESSION: Small bilateral pleural effusions blunt the costophrenic angles. These are of uncertain etiology and duration. No evidence of pneumonia. No acute or chronic changes of CHF are observed. Electronically Signed   By: David  Martinique M.D.   On: 04/21/2017 11:07    Procedures Procedures (including critical care time)  Medications Ordered in ED Medications  aspirin chewable tablet 324 mg (324 mg Oral Given 04/21/17 1101)     Initial Impression / Assessment and Plan / ED Course  I have reviewed the triage vital signs and the nursing  notes.  Pertinent labs & imaging results that were available during my care of  the patient were reviewed by me and considered in my medical decision making (see chart for details).     83 yo M with a chief complaint of chest pain.  This is reproduced on exam.  Patient however has many risk factors will obtain a delta troponin.  Initial troponin was positive but just barely so.  I discussed with the patient about possible transfer and admission.  I called to [facility] which currently is bedding16 patients in the emergency department waiting for beds.  I discussed the delay with the patient who would prefer to be seen by his cardiologist tomorrow as an outpatient.  Second troponin with no change.  I discussed with the patient that we are unable to tell for sure if he is having a heart attack and that this may leave him permanently disabled or kill him.  His wife and he understand the risks and would prefer to see the cardiologist in the morning.   3:19 PM:  I have discussed the diagnosis/risks/treatment options with the patient and family and believe the pt to be eligible for discharge home to follow-up with PCP, Cards. We also discussed returning to the ED immediately if new or worsening sx occur. We discussed the sx which are most concerning (e.g., sudden worsening pain, fever, inability to tolerate by mouth) that necessitate immediate return. Medications administered to the patient during their visit and any new prescriptions provided to the patient are listed below.  Medications given during this visit Medications  aspirin chewable tablet 324 mg (324 mg Oral Given 04/21/17 1101)     The patient appears reasonably screen and/or stabilized for discharge and I doubt any other medical condition or other [facility] requiring further screening, evaluation, or treatment in the ED at this time prior to discharge.      Final Clinical Impressions(s) / ED Diagnoses   Final diagnoses:    Atypical chest pain    New Prescriptions Discharge Medication List as of 04/21/2017  2:28 PM       Deno Etienne, DO 04/21/17 1519

## 2017-04-21 NOTE — ED Triage Notes (Signed)
Per EMS:  Pt from SNF.  Pt send for evaluation due to EKG being abnormal per staff.  Pt did say he had some chest pain yesterday but none today.

## 2017-04-21 NOTE — Assessment & Plan Note (Signed)
The patient presently has no chest pain, will obtain EKG to evaluate further, prn NTG available to him. Adding Omeprazole to treat possible GERD. Observe.

## 2017-04-27 ENCOUNTER — Non-Acute Institutional Stay (SKILLED_NURSING_FACILITY): Payer: Medicare Other | Admitting: Nurse Practitioner

## 2017-04-27 ENCOUNTER — Encounter: Payer: Self-pay | Admitting: Nurse Practitioner

## 2017-04-27 DIAGNOSIS — I480 Paroxysmal atrial fibrillation: Secondary | ICD-10-CM | POA: Diagnosis not present

## 2017-04-27 DIAGNOSIS — Z7901 Long term (current) use of anticoagulants: Secondary | ICD-10-CM

## 2017-04-27 DIAGNOSIS — Z86718 Personal history of other venous thrombosis and embolism: Secondary | ICD-10-CM

## 2017-04-27 DIAGNOSIS — R062 Wheezing: Secondary | ICD-10-CM

## 2017-04-27 DIAGNOSIS — Z5181 Encounter for therapeutic drug level monitoring: Secondary | ICD-10-CM | POA: Diagnosis not present

## 2017-04-27 NOTE — Assessment & Plan Note (Signed)
Chronic A-Fib. S/p DVT x2, His last INR 2.4 04/22/17, taking 2mg  of Coumadin 5 days/week, INR 1.6 today. Will increase Coumadin 4mg  04/27/17, then 2mg  daily/5days a week, repeat PT/INR in one week.

## 2017-04-27 NOTE — Assessment & Plan Note (Signed)
Continue long term anticoagulation treatment with Coumadin.

## 2017-04-27 NOTE — Assessment & Plan Note (Signed)
Diffused expiratory wheezes, CXR AP and lateral views to r/o PNA, DuoNeb q8h x 72hours, VS/Sat O2 qshift x 72hours. Observe.

## 2017-04-27 NOTE — Progress Notes (Signed)
Location:   SNF [facility] Room Number: 27 Place of Service:  SNF (31) Provider: Lennie Odor Gale Hulse NP  Blanchie Serve, MD  Patient Care Team: Blanchie Serve, MD as PCP - General (Internal Medicine) Giovanni Bath X, NP as Nurse Practitioner (Internal Medicine)  Extended Emergency Contact Information Primary Emergency Contact: [facility] Address: [address] [place]          [place], [place] [address] [place] Phone: [phone] Relation: Spouse Secondary Emergency Contact: Gibson,Sarah Address: [address]          [place], [place] [address] [place] Phone: [phone] Relation: Other  Code Status: DNR Goals of care: Advanced Directive information Advanced Directives 04/21/2017  Does Patient Have a Medical Advance Directive? Yes  Type of Paramedic of [place];Living will;Out of facility DNR (pink MOST or yellow form)  Does patient want to make changes to medical advance directive? -  Pre-existing out of facility DNR order (yellow form or pink MOST form) -     Chief Complaint  Patient presents with  . Acute Visit    INR    HPI:  Pt is a 83 y.o. male seen today for an acute visit for sub therapeutic anticoagulation therapy, the patient has history of Afib, heart rate is in control, taking Metoprolol 12.71m daily. His last INR 2.4 04/22/17, taking 290mof Coumadin 5 days/week, INR 1.6 today.    The patient was noted has diffused expiratory wheezes upon my examination today, his wife presented during the visit, provided Hx of him was hospitalized and treated for  wheezing and pneumonia 2014 and early this fall. He denied chest pain/palpitaiton, no O2 desaturation, he is afebrile.   Past Medical History:  Diagnosis Date  . A-fib ([place]  . Anemia   . Arthritis   . Bandemia   . Benign neoplasm of colon   . Blood glucose elevated   . 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 Corns  and callosities   . Dysphagia   . Hyperlipemia   . Hypertension   . Hyposmolality syndrome   . Long term (current) use of anticoagulants   . Malignant neoplasm of prostate ([facility] [place]  . Memory loss   . Multiple myeloma in remission ([facility]  . Pacemaker   . Post-traumatic osteoarthritis of left hip    Past Surgical History:  Procedure Laterality Date  . EYE SURGERY     cataract surgery  . FRACTURE SURGERY     left hemiarthroplasty 10/15/13 Dr. KeAlfredia Client. HEMORRHOID SURGERY    . JOINT REPLACEMENT     right knee replacement by Dr. WeOnnie Graham. PACEMAKER PLACEMENT    . PROSTATE SURGERY      Allergies  Allergen Reactions  . Codeine   . Hctz [Hydrochlorothiazide]   . Lipitor [Atorvastatin]   . Quinine Derivatives   . Sulfa Antibiotics     Allergies as of 04/27/2017      Reactions   Codeine    Hctz [hydrochlorothiazide]    Lipitor [atorvastatin]    Quinine Derivatives    Sulfa Antibiotics       Medication List        Accurate as of 04/27/17  4:44 PM. Always use your most recent med list.          acetaminophen 325 MG tablet Commonly known as:  TYLENOL Take 325 mg by mouth at bedtime as  needed.   acetaminophen 325 MG tablet Commonly known as:  TYLENOL Take 650 mg by mouth every 6 (six) hours as needed.   acyclovir 400 MG tablet Commonly known as:  ZOVIRAX Take 400 mg by mouth daily.   BIOFREEZE 4 % Gel Generic drug:  Menthol (Topical Analgesic) Apply 1 application 4 (four) times daily topically.   calcium carbonate 1500 (600 Ca) MG Tabs tablet Commonly known as:  OSCAL Take 600 mg of elemental calcium by mouth daily.   carboxymethylcellulose 0.5 % Soln Commonly known as:  REFRESH PLUS Place 1 drop into both eyes as needed.   dexamethasone 2 MG tablet Commonly known as:  DECADRON Take 10 tablets (20 mg total) by mouth once a week.   ferrous sulfate 325 (65 FE) MG EC tablet Take 325 mg by mouth daily with breakfast.   furosemide 20 MG  tablet Commonly known as:  LASIX Take 20 mg by mouth daily.   furosemide 40 MG tablet Commonly known as:  LASIX Take 40 mg by mouth daily.   metoprolol tartrate 25 MG tablet Commonly known as:  LOPRESSOR Take 12.5 mg by mouth daily.   MULTIPLE VITAMIN PO Take 1 tablet by mouth daily.   nitroGLYCERIN 0.4 MG SL tablet Commonly known as:  NITROSTAT Place 0.4 mg every 5 (five) minutes as needed under the tongue for chest pain.   omeprazole 20 MG capsule Commonly known as:  PRILOSEC Take 20 mg daily by mouth. Stop date 05/04/17   potassium chloride SA 20 MEQ tablet Commonly known as:  K-DUR,KLOR-CON Take 20 mEq by mouth daily.   senna-docusate 8.6-50 MG tablet Commonly known as:  Senokot-S Take 1 tablet by mouth daily as needed.   simvastatin 10 MG tablet Commonly known as:  ZOCOR Take 10 mg by mouth at bedtime.   warfarin 2 MG tablet Commonly known as:  COUMADIN Take 2 mg by mouth. Sunday, Monday, Tuesday, Friday and Saturday.       Review of Systems  Constitutional: Negative for activity change, appetite change, chills, diaphoresis, fatigue and fever.  HENT: Positive for hearing loss.   Respiratory: Positive for shortness of breath and wheezing. Negative for cough, choking and chest tightness.   Cardiovascular: Positive for leg swelling. Negative for chest pain and palpitations.  Gastrointestinal: Negative for abdominal distention and abdominal pain.  Genitourinary: Negative for difficulty urinating, dysuria and hematuria.  Skin: Negative for color change and pallor.  Psychiatric/Behavioral: Negative for agitation and behavioral problems.    Immunization History  Administered Date(s) Administered  . Influenza, High Dose Seasonal PF 03/05/2017  . Pneumococcal Conjugate-13 04/08/2000  . Pneumococcal Polysaccharide-23 04/08/2000  . Td 11/11/2012  . Zoster 06/05/2011   Pertinent  Health Maintenance Due  Topic Date Due  . PNA vac Low Risk Adult (2 of 2 - PPSV23)  04/08/2001  . INFLUENZA VACCINE  Completed   No flowsheet data found. Functional Status Survey:    Vitals:   04/27/17 1149  BP: 126/86  Pulse: 66  Resp: (!) 24  Temp: 97.9 F (36.6 C)  TempSrc: Oral  SpO2: 95%  Weight: 131 lb 12.8 oz (59.8 kg)  Height: 5' 9"  (1.753 m)   Body mass index is 19.46 kg/m. Physical Exam  Constitutional: He is oriented to person, place, and time. He appears well-developed and well-nourished.  HENT:  Head: Normocephalic and atraumatic.  Eyes: Conjunctivae and EOM are normal. Pupils are equal, round, and reactive to light.  Neck: Normal range of motion. Neck supple. No  JVD present. No thyromegaly present.  Cardiovascular: Normal rate and regular rhythm.  Murmur heard. Pulmonary/Chest: Effort normal. He has wheezes. He has no rales.  Diffused wheezes R+L lungs.   Abdominal: Soft. Bowel sounds are normal.  Musculoskeletal: Normal range of motion. He exhibits no edema.  Neurological: He is alert and oriented to person, place, and time. He exhibits normal muscle tone. Coordination normal.  Skin: Skin is warm and dry. No pallor.  Psychiatric: He has a normal mood and affect.    Labs reviewed: Recent Labs    03/25/17 03/30/17 04/21/17 1108  NA 137 138 138  K 3.7 4.4 3.7  CL  --   --  102  CO2  --   --  29  GLUCOSE  --   --  110*  BUN 24* 19 28*  CREATININE 0.7 0.7 0.86  CALCIUM  --   --  8.9   Recent Labs    03/09/17 03/18/17  AST 21 16  ALT 29 20  ALKPHOS 79 62   Recent Labs    03/13/17 03/18/17 03/23/17 04/21/17 1108  WBC 9.9 11.4  --  13.2*  NEUTROABS  --   --   --  10.0*  HGB 13.9 13.0* 12.9* 12.9*  HCT 41 39* 38* 40.2  MCV  --   --   --  92.4  PLT 158 172 229 191   No results found for: TSH No results found for: HGBA1C No results found for: CHOL, HDL, LDLCALC, LDLDIRECT, TRIG, CHOLHDL  Significant Diagnostic Results in last 30 days:  Dg Chest 2 View  Result Date: 04/21/2017 CLINICAL DATA:  Left-sided chest pains noted  to be greater at night and in the morning. History of atrial fibrillation, CHF with pulmonary edema, prostate malignancy EXAM: CHEST  2 VIEW COMPARISON:  None in PACs FINDINGS: The lungs are well-expanded with mild hemidiaphragm flattening. There are small bilateral pleural effusions blunting the costophrenic angles. The heart is normal in size. The pulmonary vascularity is not engorged. The ICD is in stable position. There is mild multilevel degenerative disc disease of the thoracic spine. IMPRESSION: Small bilateral pleural effusions blunt the costophrenic angles. These are of uncertain etiology and duration. No evidence of pneumonia. No acute or chronic changes of CHF are observed. Electronically Signed   By: David  Martinique M.D.   On: 04/21/2017 11:07    Assessment/Plan: Subtherapeutic anticoagulation Chronic A-Fib. S/p DVT x2, His last INR 2.4 04/22/17, taking 154m of Coumadin 5 days/week, INR 1.6 today. Will increase Coumadin 473m11/6/18, then 54m654maily/5days a week, repeat PT/INR in one week.    A-fib Heart rate is in control, continue Metoprolol 12.5mg79m daily. Observe.   History of recurrent deep vein thrombosis (DVT) Continue long term anticoagulation treatment with Coumadin.   Wheezing Diffused expiratory wheezes, CXR AP and lateral views to r/o PNA, DuoNeb q8h x 72hours, VS/Sat O2 qshift x 72hours. Observe.     Family/ staff Communication: plan of care reviewed with the patient and charge nurse  Labs/tests ordered:  PT/INR in one week. Stat CXR  Time spend 45 minutes

## 2017-04-27 NOTE — Assessment & Plan Note (Signed)
Heart rate is in control, continue Metoprolol 12.5mg  po daily. Observe.

## 2017-04-28 ENCOUNTER — Encounter: Payer: Self-pay | Admitting: Nurse Practitioner

## 2017-04-28 ENCOUNTER — Non-Acute Institutional Stay (SKILLED_NURSING_FACILITY): Payer: Medicare Other | Admitting: Nurse Practitioner

## 2017-04-28 DIAGNOSIS — F418 Other specified anxiety disorders: Secondary | ICD-10-CM | POA: Diagnosis not present

## 2017-04-28 DIAGNOSIS — I5040 Unspecified combined systolic (congestive) and diastolic (congestive) heart failure: Secondary | ICD-10-CM

## 2017-04-28 NOTE — Progress Notes (Signed)
Location:  [facility] Room Number: 58 Place of Service:  SNF (31) Provider:  Mariel Gaudin, Manxie  NP  Blanchie Serve, MD  Patient Care Team: Blanchie Serve, MD as PCP - General (Internal Medicine) Adyson Vanburen X, NP as Nurse Practitioner (Internal Medicine)  Extended Emergency Contact Information Primary Emergency Contact: [facility] Address: [address] [place]          [place], [place] [address] [place] Phone: [phone] Relation: Spouse Secondary Emergency Contact: Gibson,Sarah Address: [address]          [place], [place] [address] Johnnette Litter of [place] Phone: [phone] Relation: Other  Code Status:  Full Code Goals of care: Advanced Directive information Advanced Directives 04/21/2017  Does Patient Have a Medical Advance Directive? Yes  Type of Paramedic of [place];Living will;Out of facility DNR (pink MOST or yellow form)  Does patient want to make changes to medical advance directive? -  Pre-existing out of facility DNR order (yellow form or pink MOST form) -     Chief Complaint  Patient presents with  . Acute Visit    Congestive heart failure    HPI:  Pt is a 83 y.o. male seen today for an acute visit for anxious mood, worsened after sudden onset of wheezes 04/27/17 after Oncology visit, his wheezes subsided after DuoNeb and additional dose of Furosemide 28m qd x 3 days. His CXR 04/27/17 showed central pulmonary venous congestion without frank pulmonary edema. He has pPsychologist, forensic   Skin tear lateral right elbow from pulling tape off his venipuncture site, no sign of infection.       Past Medical History:  Diagnosis Date  . A-fib ([facility]   . Anemia   . Arthritis   . Bandemia   . Benign neoplasm of colon   . Blood glucose elevated   . Chronic thromboembolism of deep veins of proximal leg ([facility]   . Corns and callosities   . Dysphagia   . Hyperlipemia   . Hypertension   .  Hyposmolality syndrome   . Long term (current) use of anticoagulants   . Malignant neoplasm of prostate ([facility]   . Memory loss   . Multiple myeloma in remission ([facility]   . Pacemaker   . Post-traumatic osteoarthritis of left hip    Past Surgical History:  Procedure Laterality Date  . EYE SURGERY     cataract surgery  . FRACTURE SURGERY     left hemiarthroplasty 10/15/13 Dr. KAlfredia Client . HEMORRHOID SURGERY    . JOINT REPLACEMENT     right knee replacement by Dr. WOnnie Graham . PACEMAKER PLACEMENT    . PROSTATE SURGERY      Allergies  Allergen Reactions  . Codeine   . Hctz [Hydrochlorothiazide]   . Lipitor [Atorvastatin]   . Quinine Derivatives   . Sulfa Antibiotics     Outpatient Encounter Medications as of 04/28/2017  Medication Sig  . acetaminophen (TYLENOL) 325 MG tablet Take 325 mg by mouth at bedtime as needed.   .Marland Kitchenacetaminophen (TYLENOL) 325 MG tablet Take 650 mg by mouth every 6 (six) hours as needed.  .Marland Kitchenacyclovir (ZOVIRAX) 400 MG tablet Take 400 mg by mouth daily.  . calcium carbonate (OSCAL) 1500 (600 Ca) MG TABS tablet Take 600 mg of elemental calcium by mouth daily.  . carboxymethylcellulose (REFRESH PLUS) 0.5 % SOLN Place 1 drop into both eyes as needed.  .Marland Kitchendexamethasone (DECADRON) 2 MG  tablet Take 10 tablets (20 mg total) by mouth once a week.  . ferrous sulfate 325 (65 FE) MG EC tablet Take 325 mg by mouth daily with breakfast.  . furosemide (LASIX) 20 MG tablet Take 20 mg by mouth daily.  . furosemide (LASIX) 40 MG tablet Take 40 mg by mouth daily.  Marland Kitchen ipratropium-albuterol (DUONEB) 0.5-2.5 (3) MG/3ML SOLN Take 3 mLs every 8 (eight) hours as needed by nebulization (times 72 hours).  . Menthol, Topical Analgesic, (BIOFREEZE) 4 % GEL Apply 1 application 4 (four) times daily topically.  . metoprolol tartrate (LOPRESSOR) 25 MG tablet Take 12.5 mg by mouth daily.   . MULTIPLE VITAMIN PO Take 1 tablet by mouth daily.  . nitroGLYCERIN (NITROSTAT) 0.4 MG SL tablet Place  0.4 mg every 5 (five) minutes as needed under the tongue for chest pain.  Marland Kitchen omeprazole (PRILOSEC) 20 MG capsule Take 20 mg daily by mouth. Stop date 05/04/17  . potassium chloride SA (K-DUR,KLOR-CON) 20 MEQ tablet Take 20 mEq by mouth daily.  Marland Kitchen senna-docusate (SENOKOT-S) 8.6-50 MG per tablet Take 1 tablet by mouth daily as needed.   . simvastatin (ZOCOR) 10 MG tablet Take 10 mg by mouth at bedtime.   . [DISCONTINUED] warfarin (COUMADIN) 2 MG tablet Take 2 mg by mouth. Sunday, Monday, Tuesday, Friday and Saturday.   No facility-administered encounter medications on file as of 04/28/2017.     Review of Systems  Constitutional: Positive for activity change and fatigue. Negative for appetite change, chills, diaphoresis and fever.  HENT: Positive for hearing loss. Negative for congestion, sore throat, trouble swallowing and voice change.   Eyes: Negative for visual disturbance.  Respiratory: Positive for shortness of breath. Negative for cough, choking, chest tightness and wheezing.        DOE  Cardiovascular: Negative for chest pain, palpitations and leg swelling.  Musculoskeletal: Positive for gait problem.  Skin: Negative for color change, pallor, rash and wound.  Neurological: Negative for dizziness, tremors, speech difficulty, weakness and headaches.  Psychiatric/Behavioral: Negative for agitation, behavioral problems, confusion and sleep disturbance. The patient is nervous/anxious.     Immunization History  Administered Date(s) Administered  . Influenza, High Dose Seasonal PF 03/05/2017  . Pneumococcal Conjugate-13 04/08/2000  . Pneumococcal Polysaccharide-23 04/08/2000  . Td 11/11/2012  . Zoster 06/05/2011   Pertinent  Health Maintenance Due  Topic Date Due  . PNA vac Low Risk Adult (2 of 2 - PPSV23) 04/08/2001  . INFLUENZA VACCINE  Completed   No flowsheet data found. Functional Status Survey:    Vitals:   04/28/17 1234  BP: 130/76  Pulse: 72  Resp: 16  Temp: 98.1 F  (36.7 C)  SpO2: 95%  Weight: 138 lb 12.8 oz (63 kg)  Height: 5' 9"  (1.753 m)   Body mass index is 20.5 kg/m. Physical Exam  Constitutional: He is oriented to person, place, and time. He appears well-developed and well-nourished. No distress.  HENT:  Head: Normocephalic and atraumatic.  Mouth/Throat: Oropharynx is clear and moist.  Eyes: Conjunctivae and EOM are normal. Pupils are equal, round, and reactive to light.  Neck: Normal range of motion. Neck supple. No JVD present.  Cardiovascular: Normal rate and regular rhythm.  Murmur heard. Left upper chest pacemaker.   Pulmonary/Chest: He has rales.  Posterior bases R+L.   Abdominal: Soft. Bowel sounds are normal. He exhibits no distension. There is no tenderness.  Musculoskeletal: Normal range of motion. He exhibits no edema or tenderness.  Neurological: He is oriented to  person, place, and time. He exhibits normal muscle tone. Coordination normal.  Skin: Skin is warm and dry. No rash noted. He is not diaphoretic. No erythema.  Skin tear about 3cm form pulling a tape off the venipuncture site, no s/s of infection, should heal.   Psychiatric: His behavior is normal. Thought content normal.  Anxious appearance.     Labs reviewed: Recent Labs    03/25/17 03/30/17 04/21/17 1108  NA 137 138 138  K 3.7 4.4 3.7  CL  --   --  102  CO2  --   --  29  GLUCOSE  --   --  110*  BUN 24* 19 28*  CREATININE 0.7 0.7 0.86  CALCIUM  --   --  8.9   Recent Labs    03/09/17 03/18/17  AST 21 16  ALT 29 20  ALKPHOS 79 62   Recent Labs    03/13/17 03/18/17 03/23/17 04/21/17 1108  WBC 9.9 11.4  --  13.2*  NEUTROABS  --   --   --  10.0*  HGB 13.9 13.0* 12.9* 12.9*  HCT 41 39* 38* 40.2  MCV  --   --   --  92.4  PLT 158 172 229 191   No results found for: TSH No results found for: HGBA1C No results found for: CHOL, HDL, LDLCALC, LDLDIRECT, TRIG, CHOLHDL  Significant Diagnostic Results in last 30 days:  Dg Chest 2 View  Result Date:  04/21/2017 CLINICAL DATA:  Left-sided chest pains noted to be greater at night and in the morning. History of atrial fibrillation, CHF with pulmonary edema, prostate malignancy EXAM: CHEST  2 VIEW COMPARISON:  None in PACs FINDINGS: The lungs are well-expanded with mild hemidiaphragm flattening. There are small bilateral pleural effusions blunting the costophrenic angles. The heart is normal in size. The pulmonary vascularity is not engorged. The ICD is in stable position. There is mild multilevel degenerative disc disease of the thoracic spine. IMPRESSION: Small bilateral pleural effusions blunt the costophrenic angles. These are of uncertain etiology and duration. No evidence of pneumonia. No acute or chronic changes of CHF are observed. Electronically Signed   By: David  Martinique M.D.   On: 04/21/2017 11:07    Assessment/Plan No problem-specific Assessment & Plan notes found for this encounter.    Family/ staff Communication: plan of care reviewed with the patient, patient's wife, and charge nurse.   Labs/tests ordered:  BMP BNP  Time spend 25 minutes.

## 2017-04-28 NOTE — Assessment & Plan Note (Signed)
wheezes subsided after DuoNeb and additional dose of Furosemide 20mg  qd x 3 days. His CXR 04/27/17 showed central pulmonary venous congestion without frank pulmonary edema. Continue Furosemide 40mg  7 am and 20mg  2pm since 03/29/17, update BMP BNP

## 2017-04-28 NOTE — Assessment & Plan Note (Signed)
Will observe the patient, have Lorazepam 0.5mg  q6h prn x 2weeks.

## 2017-05-04 LAB — BASIC METABOLIC PANEL
BUN: 23 — AB (ref 4–21)
CREATININE: 0.8 (ref <=1.3)
GLUCOSE: 146
POTASSIUM: 3.9 (ref 3.4–5.3)
Sodium: 134 — AB (ref 137–147)

## 2017-05-04 LAB — PROTIME-INR: PROTIME: 30.3 — AB (ref 10.0–13.8)

## 2017-05-04 LAB — POCT INR: INR: 2.9 — AB (ref <=1.1)

## 2017-05-05 ENCOUNTER — Other Ambulatory Visit: Payer: Self-pay | Admitting: *Deleted

## 2017-05-11 ENCOUNTER — Other Ambulatory Visit: Payer: Self-pay | Admitting: *Deleted

## 2017-05-11 LAB — PROTIME-INR: PROTIME: 33.3 — AB (ref 10.0–13.8)

## 2017-05-11 LAB — POCT INR: INR: 3.1 — AB (ref <=1.1)

## 2017-05-18 ENCOUNTER — Non-Acute Institutional Stay (SKILLED_NURSING_FACILITY): Payer: Medicare Other | Admitting: Nurse Practitioner

## 2017-05-18 ENCOUNTER — Encounter: Payer: Self-pay | Admitting: Nurse Practitioner

## 2017-05-18 DIAGNOSIS — I481 Persistent atrial fibrillation: Secondary | ICD-10-CM

## 2017-05-18 DIAGNOSIS — Z5181 Encounter for therapeutic drug level monitoring: Secondary | ICD-10-CM | POA: Diagnosis not present

## 2017-05-18 DIAGNOSIS — Z7901 Long term (current) use of anticoagulants: Secondary | ICD-10-CM

## 2017-05-18 DIAGNOSIS — R062 Wheezing: Secondary | ICD-10-CM

## 2017-05-18 DIAGNOSIS — F418 Other specified anxiety disorders: Secondary | ICD-10-CM | POA: Diagnosis not present

## 2017-05-18 DIAGNOSIS — I4819 Other persistent atrial fibrillation: Secondary | ICD-10-CM

## 2017-05-18 DIAGNOSIS — I5020 Unspecified systolic (congestive) heart failure: Secondary | ICD-10-CM | POA: Diagnosis not present

## 2017-05-18 LAB — LIPID PANEL
CHOLESTEROL: 141 (ref 0–200)
HDL: 57 (ref 35–70)
LDL CALC: 74
LDl/HDL Ratio: 2.5
TRIGLYCERIDES: 36 — AB (ref 40–160)

## 2017-05-18 NOTE — Assessment & Plan Note (Signed)
Completed 2 weeks of prn Lorazepam, will administer Lorazepam 1mg  stat, will continue prn Lorazepam 0.5mg  q6h prn x 2 weeks. Adding Sertraline 25mg  po daily. Observe.

## 2017-05-18 NOTE — Assessment & Plan Note (Signed)
No apparent edema, but sudden onset of wheezes today, will have CXR to evaluate further, continue Furosemide 60mg /day.

## 2017-05-18 NOTE — Progress Notes (Signed)
Location:  [facility] Room Number: 46 Place of Service:  SNF (31) Provider:  Mast, Manxie  NP  Blanchie Serve, MD  Patient Care Team: Blanchie Serve, MD as PCP - General (Internal Medicine) Mast, Man X, NP as Nurse Practitioner (Internal Medicine)  Extended Emergency Contact Information Primary Emergency Contact: [facility] Address: [address] [place]          [place], [place] [address] [place] Phone: [phone] Relation: Spouse Secondary Emergency Contact: Gibson,Sarah Address: [address]          [place], [place] [address] Johnnette Litter of [place] Phone: [phone] Relation: Other  Code Status:  DNR Goals of care: Advanced Directive information Advanced Directives 04/28/2017  Does Patient Have a Medical Advance Directive? Yes  Type of Paramedic of [place];Living will;Out of facility DNR (pink MOST or yellow form)  Does patient want to make changes to medical advance directive? No - Patient declined  Copy of [place] in Chart? Yes  Pre-existing out of facility DNR order (yellow form or pink MOST form) Yellow form placed in chart (order not valid for inpatient use)     Chief Complaint  Patient presents with  . Acute Visit    Diaphoretic, wheezing, crackles    HPI:  Pt is a 83 y.o. male seen today for an acute visit for    Past Medical History:  Diagnosis Date  . A-fib ([place])   . Anemia   . Arthritis   . Bandemia   . Benign neoplasm of colon   . Blood glucose elevated   . Chronic thromboembolism of deep veins of proximal leg ([place])   . Corns and callosities   . Dysphagia   . Hyperlipemia   . Hypertension   . Hyposmolality syndrome   . Long term (current) use of anticoagulants   . Malignant neoplasm of prostate ([place])   . Memory loss   . Multiple myeloma in remission ([place])   . Pacemaker   . Post-traumatic osteoarthritis of left hip    Past Surgical  History:  Procedure Laterality Date  . EYE SURGERY     cataract surgery  . FRACTURE SURGERY     left hemiarthroplasty 10/15/13 Dr. Alfredia Client  . HEMORRHOID SURGERY    . JOINT REPLACEMENT     right knee replacement by Dr. Onnie Graham  . PACEMAKER PLACEMENT    . PROSTATE SURGERY      Allergies  Allergen Reactions  . Codeine   . Hctz [Hydrochlorothiazide]   . Lipitor [Atorvastatin]   . Quinine Derivatives   . Sulfa Antibiotics     Outpatient Encounter Medications as of 05/18/2017  Medication Sig  . acetaminophen (TYLENOL) 325 MG tablet Take 325 mg by mouth at bedtime as needed.   Marland Kitchen acetaminophen (TYLENOL) 325 MG tablet Take 650 mg by mouth every 6 (six) hours as needed.  Marland Kitchen acyclovir (ZOVIRAX) 400 MG tablet Take 400 mg by mouth daily.  . calcium carbonate (OSCAL) 1500 (600 Ca) MG TABS tablet Take 600 mg of elemental calcium by mouth daily.  . carboxymethylcellulose (REFRESH PLUS) 0.5 % SOLN Place 1 drop into both eyes as needed.  Marland Kitchen dexamethasone (DECADRON) 2 MG tablet Take 10 tablets (20 mg total) by mouth once a week.  . ferrous sulfate 325 (65 FE) MG EC tablet Take 325 mg by mouth daily with breakfast.  . furosemide (LASIX) 20 MG tablet Take 20 mg by mouth  daily.  . furosemide (LASIX) 40 MG tablet Take 40 mg by mouth daily.  Marland Kitchen ipratropium-albuterol (DUONEB) 0.5-2.5 (3) MG/3ML SOLN Take 3 mLs every 8 (eight) hours as needed by nebulization (times 72 hours).  . Menthol, Topical Analgesic, (BIOFREEZE) 4 % GEL Apply 1 application 4 (four) times daily topically.  . metoprolol tartrate (LOPRESSOR) 25 MG tablet Take 12.5 mg by mouth daily.   . MULTIPLE VITAMIN PO Take 1 tablet by mouth daily.  . nitroGLYCERIN (NITROSTAT) 0.4 MG SL tablet Place 0.4 mg every 5 (five) minutes as needed under the tongue for chest pain.  . potassium chloride SA (K-DUR,KLOR-CON) 20 MEQ tablet Take 20 mEq by mouth daily.  Marland Kitchen senna-docusate (SENOKOT-S) 8.6-50 MG per tablet Take 1 tablet by mouth daily as needed.     . simvastatin (ZOCOR) 10 MG tablet Take 10 mg by mouth at bedtime.   Marland Kitchen warfarin (COUMADIN) 1 MG tablet Take 1 mg by mouth one time only at 6 PM.  . [DISCONTINUED] omeprazole (PRILOSEC) 20 MG capsule Take 20 mg daily by mouth. Stop date 05/04/17   No facility-administered encounter medications on file as of 05/18/2017.     Review of Systems  Immunization History  Administered Date(s) Administered  . Influenza, High Dose Seasonal PF 03/05/2017  . Pneumococcal Conjugate-13 04/08/2000  . Pneumococcal Polysaccharide-23 12/07/2013  . Td 11/11/2012  . Tdap 08/23/2001  . Zoster 06/05/2011   Pertinent  Health Maintenance Due  Topic Date Due  . INFLUENZA VACCINE  Completed  . PNA vac Low Risk Adult  Completed   No flowsheet data found. Functional Status Survey:    Vitals:   05/18/17 1614  BP: (!) 156/96  Pulse: 66  Resp: (!) 22  Temp: (!) 97.2 F (36.2 C)  SpO2: 91%  Weight: 140 lb 4.8 oz (63.6 kg)  Height: 5' 9"  (1.753 m)   Body mass index is 20.72 kg/m. Physical Exam  Labs reviewed: Recent Labs    03/30/17 04/21/17 1108 05/04/17  NA 138 138 134*  K 4.4 3.7 3.9  CL  --  102  --   CO2  --  29  --   GLUCOSE  --  110*  --   BUN 19 28* 23*  CREATININE 0.7 0.86 0.8  CALCIUM  --  8.9  --    Recent Labs    03/09/17 03/18/17  AST 21 16  ALT 29 20  ALKPHOS 79 62   Recent Labs    03/13/17 03/18/17 03/23/17 04/21/17 1108  WBC 9.9 11.4  --  13.2*  NEUTROABS  --   --   --  10.0*  HGB 13.9 13.0* 12.9* 12.9*  HCT 41 39* 38* 40.2  MCV  --   --   --  92.4  PLT 158 172 229 191   No results found for: TSH No results found for: HGBA1C No results found for: CHOL, HDL, LDLCALC, LDLDIRECT, TRIG, CHOLHDL  Significant Diagnostic Results in last 30 days:  Dg Chest 2 View  Result Date: 04/21/2017 CLINICAL DATA:  Left-sided chest pains noted to be greater at night and in the morning. History of atrial fibrillation, CHF with pulmonary edema, prostate malignancy EXAM: CHEST   2 VIEW COMPARISON:  None in PACs FINDINGS: The lungs are well-expanded with mild hemidiaphragm flattening. There are small bilateral pleural effusions blunting the costophrenic angles. The heart is normal in size. The pulmonary vascularity is not engorged. The ICD is in stable position. There is mild multilevel degenerative disc disease  of the thoracic spine. IMPRESSION: Small bilateral pleural effusions blunt the costophrenic angles. These are of uncertain etiology and duration. No evidence of pneumonia. No acute or chronic changes of CHF are observed. Electronically Signed   By: David  Martinique M.D.   On: 04/21/2017 11:07    Assessment/Plan There are no diagnoses linked to this encounter.   Family/ staff Communication:   Labs/tests ordered:

## 2017-05-18 NOTE — Assessment & Plan Note (Addendum)
Sudden onset, relived by DuoNeb treatment x1, will obtain CXR in setting or CHF/pulmonary edema vs acute pulmonary process, update CBC/diff, CMP. Will have DuoNeb q6h prn available to him. Monitor.

## 2017-05-18 NOTE — Assessment & Plan Note (Signed)
Heart rate is in control, continue Metoprolol 12.5mg  po daily. Observe.

## 2017-05-18 NOTE — Progress Notes (Signed)
Location:   [facility] Room Number: 33 Place of Service:  SNF (31) Provider: Lennie Odor Kien Mirsky NP  Blanchie Serve, MD  Patient Care Team: Blanchie Serve, MD as PCP - General (Internal Medicine) Chrystie Hagwood X, NP as Nurse Practitioner (Internal Medicine)  Extended Emergency Contact Information Primary Emergency Contact: [facility] Address: [address] [place]          [place], [place] [address] [place] Phone: [phone] Relation: Spouse Secondary Emergency Contact: Gibson,Sarah Address: [address]          [place], [place] [address] Johnnette Litter of [place] Phone: [phone] Relation: Other  Code Status: DNR Goals of care: Advanced Directive information Advanced Directives 04/28/2017  Does Patient Have a Medical Advance Directive? Yes  Type of Paramedic of [place];Living will;Out of facility DNR (pink MOST or yellow form)  Does patient want to make changes to medical advance directive? No - Patient declined  Copy of [place] in Chart? Yes  Pre-existing out of facility DNR order (yellow form or pink MOST form) Yellow form placed in chart (order not valid for inpatient use)     Chief Complaint  Patient presents with  . Acute Visit    Diaphoretic, wheezing, crackles    HPI:  Pt is a 83 y.o. male seen today for an acute visit for diaphoretic, anxious, wheezing, vital signs are stable, no O2 desaturation. Wheezes was resolved after x2 DuoNeb treatment. He denied chest pain, palpitation, sense of impending doom. CHF, no apparent edema while on Furosemide 59m daily. Sub therapeutic INR 1.6 05/17/17 after Coumadin 250mheld x 3 days for supra therapeutic INR 3.6, Coumadin was resumed at 21m73mo daily, scheduled. PT/INR in 3 days. Afib, heart rate is in control, taking Metoprolol 12.5mg5mily.    Past Medical History:  Diagnosis Date  . A-fib (HCC)[place]. Anemia   . Arthritis   . Bandemia   . Benign  neoplasm of colon   . Blood glucose elevated   . Chronic thromboembolism of deep veins of proximal leg (HCC)[place]. Corns and callosities   . Dysphagia   . Hyperlipemia   . Hypertension   . Hyposmolality syndrome   . Long term (current) use of anticoagulants   . Malignant neoplasm of prostate (HCC)[place]. Memory loss   . Multiple myeloma in remission (HCC)[place]. Pacemaker   . Post-traumatic osteoarthritis of left hip    Past Surgical History:  Procedure Laterality Date  . EYE SURGERY     cataract surgery  . FRACTURE SURGERY     left hemiarthroplasty 10/15/13 Dr. KennAlfredia ClientHEMORRHOID SURGERY    . JOINT REPLACEMENT     right knee replacement by Dr. WellOnnie GrahamPACEMAKER PLACEMENT    . PROSTATE SURGERY      Allergies  Allergen Reactions  . Codeine   . Hctz [Hydrochlorothiazide]   . Lipitor [Atorvastatin]   . Quinine Derivatives   . Sulfa Antibiotics     Allergies as of 05/18/2017      Reactions   Codeine    Hctz [hydrochlorothiazide]    Lipitor [atorvastatin]    Quinine Derivatives    Sulfa Antibiotics       Medication List        Accurate as of 05/18/17 11:59 PM. Always use your most recent med list.          acetaminophen  325 MG tablet Commonly known as:  TYLENOL Take 325 mg by mouth at bedtime as needed.   acetaminophen 325 MG tablet Commonly known as:  TYLENOL Take 650 mg by mouth every 6 (six) hours as needed.   acyclovir 400 MG tablet Commonly known as:  ZOVIRAX Take 400 mg by mouth daily.   BIOFREEZE 4 % Gel Generic drug:  Menthol (Topical Analgesic) Apply 1 application 4 (four) times daily topically.   calcium carbonate 1500 (600 Ca) MG Tabs tablet Commonly known as:  OSCAL Take 600 mg of elemental calcium by mouth daily.   carboxymethylcellulose 0.5 % Soln Commonly known as:  REFRESH PLUS Place 1 drop into both eyes as needed.   dexamethasone 2 MG tablet Commonly known as:  DECADRON Take 10 tablets (20 mg total) by mouth once a  week.   ferrous sulfate 325 (65 FE) MG EC tablet Take 325 mg by mouth daily with breakfast.   furosemide 20 MG tablet Commonly known as:  LASIX Take 20 mg by mouth daily.   furosemide 40 MG tablet Commonly known as:  LASIX Take 40 mg by mouth daily.   ipratropium-albuterol 0.5-2.5 (3) MG/3ML Soln Commonly known as:  DUONEB Take 3 mLs every 8 (eight) hours as needed by nebulization (times 72 hours).   metoprolol tartrate 25 MG tablet Commonly known as:  LOPRESSOR Take 12.5 mg by mouth daily.   MULTIPLE VITAMIN PO Take 1 tablet by mouth daily.   nitroGLYCERIN 0.4 MG SL tablet Commonly known as:  NITROSTAT Place 0.4 mg every 5 (five) minutes as needed under the tongue for chest pain.   potassium chloride SA 20 MEQ tablet Commonly known as:  K-DUR,KLOR-CON Take 20 mEq by mouth daily.   senna-docusate 8.6-50 MG tablet Commonly known as:  Senokot-S Take 1 tablet by mouth daily as needed.   simvastatin 10 MG tablet Commonly known as:  ZOCOR Take 10 mg by mouth at bedtime.   warfarin 1 MG tablet Commonly known as:  COUMADIN Take 1 mg by mouth one time only at 6 PM.       Review of Systems  Constitutional: Positive for activity change, diaphoresis and fatigue. Negative for appetite change, chills and fever.  HENT: Positive for hearing loss. Negative for congestion and voice change.   Respiratory: Positive for wheezing. Negative for cough, choking and chest tightness.   Cardiovascular: Negative for chest pain, palpitations and leg swelling.  Gastrointestinal: Negative for abdominal distention, abdominal pain, diarrhea, nausea and vomiting.  Genitourinary: Negative for difficulty urinating, dysuria and urgency.  Skin: Negative for color change, pallor, rash and wound.  Neurological: Negative for tremors, speech difficulty, weakness and headaches.  Psychiatric/Behavioral: Negative for agitation, behavioral problems, confusion and hallucinations. The patient is  nervous/anxious.     Immunization History  Administered Date(s) Administered  . Influenza, High Dose Seasonal PF 03/05/2017  . Pneumococcal Conjugate-13 04/08/2000  . Pneumococcal Polysaccharide-23 12/07/2013  . Td 11/11/2012  . Tdap 08/23/2001  . Zoster 06/05/2011   Pertinent  Health Maintenance Due  Topic Date Due  . INFLUENZA VACCINE  Completed  . PNA vac Low Risk Adult  Completed   No flowsheet data found. Functional Status Survey:    Vitals:   05/18/17 1614  BP: (!) 156/96  Pulse: 66  Resp: (!) 22  Temp: (!) 97.2 F (36.2 C)  SpO2: 91%  Weight: 140 lb 4.8 oz (63.6 kg)  Height: _0  (1.753 m)   Body mass index is 20.72 kg/m. Physical Exam  Constitutional: He is oriented to person, place, and time. He appears well-developed and well-nourished. He appears distressed.  HENT:  Head: Normocephalic and atraumatic.  Eyes: Conjunctivae and EOM are normal. Pupils are equal, round, and reactive to light.  Neck: Normal range of motion. Neck supple. No JVD present. No thyromegaly present.  Cardiovascular: Normal rate and regular rhythm.  Murmur heard. Pulmonary/Chest: He is in respiratory distress. He has wheezes. He has rales. He exhibits no tenderness.  Abdominal: Soft. Bowel sounds are normal. He exhibits no distension. There is no tenderness.  Musculoskeletal: Normal range of motion. He exhibits no edema.  Neurological: He is alert and oriented to person, place, and time. He exhibits abnormal muscle tone.  Skin: Skin is warm. No rash noted. He is diaphoretic. No erythema. No pallor.  Psychiatric:  Anxious, restless    Labs reviewed: Recent Labs    03/30/17 04/21/17 1108 05/04/17  NA 138 138 134*  K 4.4 3.7 3.9  CL  --  102  --   CO2  --  29  --   GLUCOSE  --  110*  --   BUN 19 28* 23*  CREATININE 0.7 0.86 0.8  CALCIUM  --  8.9  --    Recent Labs    03/09/17 03/18/17  AST 21 16  ALT 29 20  ALKPHOS 79 62   Recent Labs    03/13/17 03/18/17 03/23/17  04/21/17 1108  WBC 9.9 11.4  --  13.2*  NEUTROABS  --   --   --  10.0*  HGB 13.9 13.0* 12.9* 12.9*  HCT 41 39* 38* 40.2  MCV  --   --   --  92.4  PLT 158 172 229 191   No results found for: TSH No results found for: HGBA1C Lab Results  Component Value Date   CHOL 141 05/18/2017   HDL 57 05/18/2017   LDLCALC 74 05/18/2017   TRIG 36 (A) 05/18/2017    Significant Diagnostic Results in last 30 days:  Dg Chest 2 View  Result Date: 04/21/2017 CLINICAL DATA:  Left-sided chest pains noted to be greater at night and in the morning. History of atrial fibrillation, CHF with pulmonary edema, prostate malignancy EXAM: CHEST  2 VIEW COMPARISON:  None in PACs FINDINGS: The lungs are well-expanded with mild hemidiaphragm flattening. There are small bilateral pleural effusions blunting the costophrenic angles. The heart is normal in size. The pulmonary vascularity is not engorged. The ICD is in stable position. There is mild multilevel degenerative disc disease of the thoracic spine. IMPRESSION: Small bilateral pleural effusions blunt the costophrenic angles. These are of uncertain etiology and duration. No evidence of pneumonia. No acute or chronic changes of CHF are observed. Electronically Signed   By: David  Martinique M.D.   On: 04/21/2017 11:07    Assessment/Plan: Wheezing Sudden onset, relived by DuoNeb treatment x1, will obtain CXR in setting or CHF/pulmonary edema vs acute pulmonary process, update CBC/diff, CMP. Will have DuoNeb q6h prn available to him. Monitor.   Situational anxiety Completed 2 weeks of prn Lorazepam, will administer Lorazepam 27m stat, will continue prn Lorazepam 0.568mq6h prn x 2 weeks. Adding Sertraline 258mo daily. Observe.   Subtherapeutic anticoagulation Sub therapeutic INR 1.6 05/17/17 after Coumadin 2mg23mld x 3 days for supra therapeutic INR 3.6, Coumadin was resumed at 1mg 31mdaily, scheduled. PT/INR in 3 days. Hx of chronic Afib, DVT x2.   A-fib Heart rate is  in control, continue Metoprolol 12.5mg p21maily.  Observe.   CHF (congestive heart failure) (HCC) No apparent edema, but sudden onset of wheezes today, will have CXR to evaluate further, continue Furosemide 84m/day.     Family/ staff Communication: plan of care reviewed with the patient, patient's wife, and charge nurse.   Labs/tests ordered: CBC/diff, CMP, CXR   Time spend 25 minutes.

## 2017-05-18 NOTE — Assessment & Plan Note (Signed)
Sub therapeutic INR 1.6 05/17/17 after Coumadin 2mg  held x 3 days for supra therapeutic INR 3.6, Coumadin was resumed at 1mg  po daily, scheduled. PT/INR in 3 days. Hx of chronic Afib, DVT x2.

## 2017-05-19 ENCOUNTER — Encounter: Payer: Self-pay | Admitting: Nurse Practitioner

## 2017-05-19 ENCOUNTER — Other Ambulatory Visit: Payer: Self-pay | Admitting: *Deleted

## 2017-05-19 DIAGNOSIS — D72829 Elevated white blood cell count, unspecified: Secondary | ICD-10-CM | POA: Insufficient documentation

## 2017-05-19 LAB — CBC
HCT: 36.6
HGB: 12.4
WBC: 24
platelet count: 183

## 2017-05-19 LAB — COMPLETE METABOLIC PANEL WITH GFR
ALK PHOS: 73
ALT: 25
AST: 24
Albumin: 3.5
BILIRUBIN TOTAL: 1.1
BUN: 29 — AB (ref 4–21)
CALCIUM: 9
GLUCOSE: 124
Potassium: 4.2
Sodium: 137
TOTAL PROTEIN: 5.5 g/dL

## 2017-05-20 LAB — PROTIME-INR
INR: 1.3 — AB (ref 0.9–1.1)
PT: 13.4

## 2017-05-21 ENCOUNTER — Encounter: Payer: Self-pay | Admitting: Internal Medicine

## 2017-05-21 ENCOUNTER — Non-Acute Institutional Stay (SKILLED_NURSING_FACILITY): Payer: Medicare Other | Admitting: Internal Medicine

## 2017-05-21 DIAGNOSIS — R2681 Unsteadiness on feet: Secondary | ICD-10-CM | POA: Diagnosis not present

## 2017-05-21 DIAGNOSIS — I480 Paroxysmal atrial fibrillation: Secondary | ICD-10-CM

## 2017-05-21 DIAGNOSIS — F03918 Unspecified dementia, unspecified severity, with other behavioral disturbance: Secondary | ICD-10-CM | POA: Insufficient documentation

## 2017-05-21 DIAGNOSIS — F0391 Unspecified dementia with behavioral disturbance: Secondary | ICD-10-CM

## 2017-05-21 DIAGNOSIS — D72829 Elevated white blood cell count, unspecified: Secondary | ICD-10-CM

## 2017-05-21 DIAGNOSIS — I5020 Unspecified systolic (congestive) heart failure: Secondary | ICD-10-CM

## 2017-05-21 DIAGNOSIS — E871 Hypo-osmolality and hyponatremia: Secondary | ICD-10-CM

## 2017-05-21 NOTE — Progress Notes (Signed)
Location:  [facility] Room Number: 75 Place of Service:  SNF (31) Provider:  Blanchie Serve MD  Blanchie Serve, MD  Patient Care Team: Blanchie Serve, MD as PCP - General (Internal Medicine) Mast, Man X, NP as Nurse Practitioner (Internal Medicine)  Extended Emergency Contact Information Primary Emergency Contact: [facility] Address: [address] [place]          [place], [place] [address] [place] Phone: [phone] Relation: Spouse Secondary Emergency Contact: Gibson,Sarah Address: [address]          [place], [place] [address] Johnnette Litter of [place] Phone: [phone] Relation: Other  Code Status:  DNR  Goals of care: Advanced Directive information Advanced Directives 04/28/2017  Does Patient Have a Medical Advance Directive? Yes  Type of Paramedic of [place];Living will;Out of facility DNR (pink MOST or yellow form)  Does patient want to make changes to medical advance directive? No - Patient declined  Copy of [place] in Chart? Yes  Pre-existing out of facility DNR order (yellow form or pink MOST form) Yellow form placed in chart (order not valid for inpatient use)     Chief Complaint  Patient presents with  . Medical Management of Chronic Issues    HPI:  Pt is a 83 y.o. male seen today for medical management of chronic diseases. He is seen with his wife present. He is pleasantly confused and denies any concern. No concern from nursing. Of note, he had a fall 3 nights back. He sustained skin tears. He has progressive weakness to his lower extremities and is now wheelchair bound. He is to follow with cardiology today.    Past Medical History:  Diagnosis Date  . A-fib ([place])   . Anemia   . Arthritis   . Bandemia   . Benign neoplasm of colon   . Blood glucose elevated   . Chronic thromboembolism of deep veins of proximal leg ([place])   . Corns and callosities     . Dysphagia   . Hyperlipemia   . Hypertension   . Hyposmolality syndrome   . Long term (current) use of anticoagulants   . Malignant neoplasm of prostate ([place])   . Memory loss   . Multiple myeloma in remission ([place])   . Pacemaker   . Post-traumatic osteoarthritis of left hip    Past Surgical History:  Procedure Laterality Date  . EYE SURGERY     cataract surgery  . FRACTURE SURGERY     left hemiarthroplasty 10/15/13 Dr. Alfredia Client  . HEMORRHOID SURGERY    . JOINT REPLACEMENT     right knee replacement by Dr. Onnie Graham  . PACEMAKER PLACEMENT    . PROSTATE SURGERY      Allergies  Allergen Reactions  . Codeine   . Hctz [Hydrochlorothiazide]   . Lipitor [Atorvastatin]   . Quinine Derivatives   . Sulfa Antibiotics     Outpatient Encounter Medications as of 05/21/2017  Medication Sig  . acetaminophen (TYLENOL) 325 MG tablet Take 325 mg by mouth at bedtime as needed.   Marland Kitchen acetaminophen (TYLENOL) 325 MG tablet Take 650 mg by mouth every 6 (six) hours as needed.  Marland Kitchen acyclovir (ZOVIRAX) 400 MG tablet Take 400 mg by mouth daily.  . calcium carbonate (OSCAL) 1500 (600 Ca) MG TABS tablet Take 600 mg of elemental calcium by mouth daily.  Marland Kitchen dexamethasone (DECADRON) 2 MG tablet Take 10 tablets (20 mg  total) by mouth once a week.  . ferrous sulfate 325 (65 FE) MG EC tablet Take 325 mg by mouth daily with breakfast.  . furosemide (LASIX) 20 MG tablet Take 20 mg by mouth daily.  . furosemide (LASIX) 40 MG tablet Take 40 mg by mouth daily.  Marland Kitchen ipratropium-albuterol (DUONEB) 0.5-2.5 (3) MG/3ML SOLN Take 3 mLs every 8 (eight) hours as needed by nebulization (times 72 hours).  . LORazepam (ATIVAN) 0.5 MG tablet Take 0.5 mg by mouth every 6 (six) hours as needed for anxiety.  . metoprolol tartrate (LOPRESSOR) 25 MG tablet Take 12.5 mg by mouth daily.   . MULTIPLE VITAMIN PO Take 1 tablet by mouth daily.  . nitroGLYCERIN (NITROSTAT) 0.4 MG SL tablet Place 0.4 mg every 5 (five) minutes as needed  under the tongue for chest pain.  . potassium chloride SA (K-DUR,KLOR-CON) 20 MEQ tablet Take 20 mEq by mouth daily.  Marland Kitchen senna-docusate (SENOKOT-S) 8.6-50 MG per tablet Take 1 tablet by mouth daily as needed.   . sertraline (ZOLOFT) 25 MG tablet Take 25 mg by mouth daily.  . simvastatin (ZOCOR) 10 MG tablet Take 10 mg by mouth at bedtime.   Marland Kitchen warfarin (COUMADIN) 2 MG tablet Take 2 mg by mouth one time only at 6 PM.  . [DISCONTINUED] carboxymethylcellulose (REFRESH PLUS) 0.5 % SOLN Place 1 drop into both eyes as needed.  . [DISCONTINUED] Menthol, Topical Analgesic, (BIOFREEZE) 4 % GEL Apply 1 application 4 (four) times daily topically.  . [DISCONTINUED] warfarin (COUMADIN) 1 MG tablet Take 1 mg by mouth one time only at 6 PM.   No facility-administered encounter medications on file as of 05/21/2017.     Review of Systems  Constitutional: Negative for chills and fever.       Poor appetite.   HENT: Positive for hearing loss. Negative for congestion.   Eyes: Positive for visual disturbance.  Respiratory: Negative for cough and shortness of breath.   Cardiovascular: Negative for chest pain and palpitations.  Gastrointestinal: Negative for abdominal pain, nausea and vomiting.  Genitourinary: Negative for dysuria.  Musculoskeletal: Positive for gait problem.  Skin: Positive for wound. Negative for rash.  Neurological: Positive for weakness. Negative for dizziness and headaches.  Hematological: Bruises/bleeds easily.  Psychiatric/Behavioral: Positive for behavioral problems and confusion.       Episodes of anxiety periodically and searching for his wife.     Immunization History  Administered Date(s) Administered  . Influenza, High Dose Seasonal PF 03/05/2017  . Pneumococcal Conjugate-13 04/08/2000  . Pneumococcal Polysaccharide-23 12/07/2013  . Td 11/11/2012  . Tdap 08/23/2001  . Zoster 06/05/2011   Pertinent  Health Maintenance Due  Topic Date Due  . INFLUENZA VACCINE  Completed  .  PNA vac Low Risk Adult  Completed   No flowsheet data found. Functional Status Survey:    Vitals:   05/21/17 1052  BP: 120/80  Pulse: 66  Temp: (!) 97.4 F (36.3 C)  Weight: 138 lb 11.2 oz (62.9 kg)  Height: _0  (1.753 m)   Body mass index is 20.48 kg/m.   Wt Readings from Last 3 Encounters:  05/21/17 138 lb 11.2 oz (62.9 kg)  05/18/17 140 lb 4.8 oz (63.6 kg)  04/28/17 138 lb 12.8 oz (63 kg)   Physical Exam  Constitutional:  Thin built, frail, elderly male in no acute distress  HENT:  Head: Normocephalic and atraumatic.  Mouth/Throat: Oropharynx is clear and moist.  Eyes: EOM are normal. Pupils are equal, round, and reactive to  light.  Has corrective glasses  Neck: Normal range of motion. Neck supple.  Cardiovascular: Normal rate and regular rhythm.  Pulmonary/Chest: Effort normal and breath sounds normal. He has no wheezes. He has no rales.  Abdominal: Soft. Bowel sounds are normal. There is no tenderness. There is no guarding.  Musculoskeletal: He exhibits edema and deformity.  Can move all 4 extremities, weakness more to his legs, arthritis changes to fingers, wheelchair bound, needs assistance with transfers.   Lymphadenopathy:    He has no cervical adenopathy.  Neurological: He is alert.  Oriented to self and place only  Skin: Skin is warm and dry. He is not diaphoretic.  Skin tear present  Psychiatric:  Pleasantly confused this visit.     Labs reviewed: Recent Labs    03/30/17 04/21/17 1108 05/04/17  NA 138 138 134*  K 4.4 3.7 3.9  CL  --  102  --   CO2  --  29  --   GLUCOSE  --  110*  --   BUN 19 28* 23*  CREATININE 0.7 0.86 0.8  CALCIUM  --  8.9  --    Recent Labs    03/09/17 03/18/17  AST 21 16  ALT 29 20  ALKPHOS 79 62   Recent Labs    03/13/17 03/18/17 03/23/17 04/21/17 1108  WBC 9.9 11.4  --  13.2*  NEUTROABS  --   --   --  10.0*  HGB 13.9 13.0* 12.9* 12.9*  HCT 41 39* 38* 40.2  MCV  --   --   --  92.4  PLT 158 172 229 191   No  results found for: TSH No results found for: HGBA1C Lab Results  Component Value Date   CHOL 141 05/18/2017   HDL 57 05/18/2017   LDLCALC 74 05/18/2017   TRIG 36 (A) 05/18/2017    Significant Diagnostic Results in last 30 days:  No results found.  Assessment/Plan  1. Systolic congestive heart failure, unspecified HF chronicity (HCC) Currently on lasix 40 mg daily am and 20 mg pm with metoprolol tartrate 12.5 mg daily. No signs of fluid overload on exam today. Continue kcl supplement. Monitor bmp  2. Paroxysmal atrial fibrillation (HCC) Continue metoprolol tartrate 12.5 mg daily for rate control. On warfarin 2 mg daily for stroke prevention. With his high fall risk, will have cardiology assess for risk vs benefit and discontinuation of anticoagulation.  3. Unsteady gait With history of neuropathy and his deconditioning. Has poor safety awareness with dementia. Fall precautions. Wheelchair for ambulation. Remains a high fall risk.   4. Dementia with behavioral disturbance, unspecified dementia type Supportive care. Start aricept 5 mg daily x 1 week, then 10 mg daily to help with his memory loss and behavioral changes. Continue sertraline daily and prn lorazepam for now. Obtain MMSE  5. Hyponatremia With him on lasix, check bmp periodically.   6. Leukocytosis Noted on lab review from ED visit 04/21/17. Afebrile at present. Has history of multiple myeloma. Recheck cbc with diff.    Family/ staff Communication: reviewed care plan with patient and charge nurse.    Labs/tests ordered:  Bmp, cbc with diff in 1 week   Blanchie Serve, MD Internal Medicine [facility] [address] [place], [place] [address] Cell Phone (Monday-Friday 8 am - 5 pm): [phone] On Call: [phone] and follow prompts after 5 pm and on weekends Office Phone: [phone] Office Fax: [phone]

## 2017-05-24 LAB — PROTIME-INR
INR: 1.9 — AB (ref 0.9–1.1)
PT: 20.1

## 2017-05-26 ENCOUNTER — Encounter: Payer: Self-pay | Admitting: *Deleted

## 2017-06-01 ENCOUNTER — Encounter: Payer: Self-pay | Admitting: Internal Medicine

## 2017-06-01 ENCOUNTER — Non-Acute Institutional Stay (SKILLED_NURSING_FACILITY): Payer: Medicare Other | Admitting: Internal Medicine

## 2017-06-01 DIAGNOSIS — Z7901 Long term (current) use of anticoagulants: Secondary | ICD-10-CM

## 2017-06-01 DIAGNOSIS — I5021 Acute systolic (congestive) heart failure: Secondary | ICD-10-CM | POA: Diagnosis not present

## 2017-06-01 DIAGNOSIS — R410 Disorientation, unspecified: Secondary | ICD-10-CM

## 2017-06-01 DIAGNOSIS — F99 Mental disorder, not otherwise specified: Secondary | ICD-10-CM | POA: Diagnosis not present

## 2017-06-01 DIAGNOSIS — E871 Hypo-osmolality and hyponatremia: Secondary | ICD-10-CM | POA: Diagnosis not present

## 2017-06-01 NOTE — Progress Notes (Signed)
Location:  [facility] Room Number: 10 Place of Service:  SNF (31) Provider:  Blanchie Serve, MD  Blanchie Serve, MD  Patient Care Team: Blanchie Serve, MD as PCP - General (Internal Medicine) Mast, Man X, NP as Nurse Practitioner (Internal Medicine)  Extended Emergency Contact Information Primary Emergency Contact: [facility] Address: [address] [place]          [place], [place] [address] [place] Phone: [phone] Relation: Spouse Secondary Emergency Contact: Gibson,Sarah Address: [address]          [place], [place] [address] [place] of [place] Phone: [phone] Relation: Other  Code Status:  DNR  Goals of care: Advanced Directive information Advanced Directives 06/01/2017  Does Patient Have a Medical Advance Directive? Yes  Type of Advance Directive Out of facility DNR (pink MOST or yellow form)  Does patient want to make changes to medical advance directive? -  Copy of [place] in Chart? -  Pre-existing out of facility DNR order (yellow form or pink MOST form) Pink MOST form placed in chart (order not valid for inpatient use);Yellow form placed in chart (order not valid for inpatient use)     Chief Complaint  Patient presents with  . Acute Visit    Increased fatigue and confusion    HPI:  Pt is a 83 y.o. male seen today for an acute visit for faigue and confusion. Pt seen in the room with his wife at bedside. He is pleasantly confused and falling asleep during conversation. He is in no acute distress. He denies any complaint but his HPI and ROS are limited with his dementia and confusion. He has been afebrile per nursing. He has occasional cough. On chart review, vital signs are stable. He has gained 6 lbs on chart review since 05/27/17.    Past Medical History:  Diagnosis Date  . A-fib ([place])   . Anemia   . Arthritis   . Bandemia   . Benign neoplasm of colon   . Blood glucose  elevated   . Chronic thromboembolism of deep veins of proximal leg ([place])   . Corns and callosities   . Dysphagia   . Hyperlipemia   . Hypertension   . Hyposmolality syndrome   . Long term (current) use of anticoagulants   . Malignant neoplasm of prostate ([place])   . Memory loss   . Multiple myeloma in remission ([place])   . Pacemaker   . Post-traumatic osteoarthritis of left hip    Past Surgical History:  Procedure Laterality Date  . EYE SURGERY     cataract surgery  . FRACTURE SURGERY     left hemiarthroplasty 10/15/13 Dr. Alfredia Client  . HEMORRHOID SURGERY    . JOINT REPLACEMENT     right knee replacement by Dr. Onnie Graham  . PACEMAKER PLACEMENT    . PROSTATE SURGERY      Allergies  Allergen Reactions  . Codeine   . Hctz [Hydrochlorothiazide]   . Lipitor [Atorvastatin]   . Quinine Derivatives   . Sulfa Antibiotics     Outpatient Encounter Medications as of 06/01/2017  Medication Sig  . acetaminophen (TYLENOL) 325 MG tablet Take 325 mg by mouth at bedtime as needed.   Marland Kitchen acetaminophen (TYLENOL) 325 MG tablet Take 650 mg by mouth every 6 (six) hours as needed.  Marland Kitchen acyclovir (ZOVIRAX) 400 MG tablet Take 400 mg by mouth daily.  Marland Kitchen amoxicillin (AMOXIL) 500 MG capsule Take  2,000 mg by mouth once as needed (Prior to dental appointment).  . calcium carbonate (OSCAL) 1500 (600 Ca) MG TABS tablet Take 600 mg of elemental calcium by mouth daily.  Marland Kitchen dexamethasone (DECADRON) 2 MG tablet Take 10 tablets (20 mg total) by mouth once a week.  . donepezil (ARICEPT) 10 MG tablet Take 10 mg by mouth at bedtime.  . ferrous sulfate 325 (65 FE) MG EC tablet Take 325 mg by mouth daily with breakfast.  . furosemide (LASIX) 20 MG tablet Take 20 mg by mouth daily.  . furosemide (LASIX) 40 MG tablet Take 40 mg by mouth daily.  Marland Kitchen ipratropium-albuterol (DUONEB) 0.5-2.5 (3) MG/3ML SOLN Take 3 mLs every 8 (eight) hours as needed by nebulization (times 72 hours).  . Menthol, Topical Analgesic, (BIOFREEZE) 4 %  GEL Apply 1 application topically daily as needed.  . metoprolol tartrate (LOPRESSOR) 25 MG tablet Take 12.5 mg by mouth daily.   . MULTIPLE VITAMIN PO Take 1 tablet by mouth daily.  . nitroGLYCERIN (NITROSTAT) 0.4 MG SL tablet Place 0.4 mg every 5 (five) minutes as needed under the tongue for chest pain.  . Polyvinyl Alcohol-Povidone (REFRESH OP) Apply 1 drop to eye as needed.  . potassium chloride SA (K-DUR,KLOR-CON) 20 MEQ tablet Take 20 mEq by mouth daily.  Marland Kitchen senna-docusate (SENOKOT-S) 8.6-50 MG per tablet Take 1 tablet by mouth daily as needed.   . sertraline (ZOLOFT) 25 MG tablet Take 25 mg by mouth daily.  . simvastatin (ZOCOR) 10 MG tablet Take 10 mg by mouth at bedtime.   Marland Kitchen warfarin (COUMADIN) 2 MG tablet Take 2 mg by mouth one time only at 6 PM.  . [DISCONTINUED] LORazepam (ATIVAN) 0.5 MG tablet Take 0.5 mg by mouth every 6 (six) hours as needed for anxiety.   No facility-administered encounter medications on file as of 06/01/2017.     Review of Systems  Unable to perform ROS: Dementia    Immunization History  Administered Date(s) Administered  . Influenza, High Dose Seasonal PF 03/05/2017  . Pneumococcal Conjugate-13 04/08/2000  . Pneumococcal Polysaccharide-23 12/07/2013  . Td 11/11/2012  . Tdap 08/23/2001  . Zoster 06/05/2011   Pertinent  Health Maintenance Due  Topic Date Due  . INFLUENZA VACCINE  Completed  . PNA vac Low Risk Adult  Completed   No flowsheet data found. Functional Status Survey:    Vitals:   06/01/17 1145  BP: 118/74  Pulse: 68  Resp: 18  Temp: 98 F (36.7 C)  SpO2: 95%  Weight: 140 lb 11.2 oz (63.8 kg)  Height: 5' 9"  (1.753 m)   Body mass index is 20.78 kg/m.   Wt Readings from Last 3 Encounters:  06/01/17 140 lb 11.2 oz (63.8 kg)  05/21/17 138 lb 11.2 oz (62.9 kg)  05/18/17 140 lb 4.8 oz (63.6 kg)   Physical Exam  Constitutional:  Thin built elderly frail male in no acute distress   HENT:  Head: Normocephalic and  atraumatic.  Mouth/Throat: Oropharynx is clear and moist.  Eyes: Conjunctivae are normal. Pupils are equal, round, and reactive to light.  Neck: Neck supple.  Cardiovascular: Normal rate and regular rhythm.  Systolic murmur present  Pulmonary/Chest: Effort normal and breath sounds normal. No respiratory distress. He has no wheezes. He has no rales.  Abdominal: Soft. Bowel sounds are normal. He exhibits no distension. There is no tenderness. There is no guarding.  Musculoskeletal: He exhibits edema and deformity.  Lymphadenopathy:    He has no cervical adenopathy.  Neurological:  Sleepy, oriented only to person  Skin: Skin is warm and dry.  Easy bruising  Psychiatric:  Pleasantly confused    Labs reviewed: Recent Labs    03/30/17 04/21/17 1108 05/04/17  NA 138 138 134*  K 4.4 3.7 3.9  CL  --  102  --   CO2  --  29  --   GLUCOSE  --  110*  --   BUN 19 28* 23*  CREATININE 0.7 0.86 0.8  CALCIUM  --  8.9  --    Recent Labs    03/09/17 03/18/17  AST 21 16  ALT 29 20  ALKPHOS 79 62   Recent Labs    03/13/17 03/18/17 03/23/17 04/21/17 1108  WBC 9.9 11.4  --  13.2*  NEUTROABS  --   --   --  10.0*  HGB 13.9 13.0* 12.9* 12.9*  HCT 41 39* 38* 40.2  MCV  --   --   --  92.4  PLT 158 172 229 191   No results found for: TSH No results found for: HGBA1C Lab Results  Component Value Date   CHOL 141 05/18/2017   HDL 57 05/18/2017   LDLCALC 74 05/18/2017   TRIG 36 (A) 05/18/2017    Significant Diagnostic Results in last 30 days:  No results found.  Assessment/Plan  confusion Per wife and nursing, increased confusion for a week. No clinical signs of infection. Has known hyponatremia with him on lasix. Appetite is good. Afebrile. Has dementia and was recently started on aricept. D/c aricept with increased confusion. Check cbc with diff and BMP.   CHF exacerbation Has some leg edema. Breathing is stable. Currently on lasix 40 mg am and 20 mg in pm with kcl supplement. Has  weight gain, monitor clinically. Lasix 40 mg bid x 3 days, then 40 mg am and 20 mg pm. Monitor daily weight.   Hyponatremia With him on lasix, check bmp, maintain hydration  Long term anticoagulation On warfarin, inr today 2.3. Continue coumadin 2 mg daily and check inr 06/07/17    Family/ staff Communication: reviewed care plan with patient, his wife and charge nurse.   Labs/tests ordered:  Bmp, cbc with diff, INR  Blanchie Serve, MD Internal Medicine [facility] [address] [place], [place] [address] Cell Phone (Monday-Friday 8 am - 5 pm): [phone] On Call: [phone] and follow prompts after 5 pm and on weekends Office Phone: [phone] Office Fax: [phone]

## 2017-06-09 ENCOUNTER — Non-Acute Institutional Stay (SKILLED_NURSING_FACILITY): Payer: Medicare Other | Admitting: Nurse Practitioner

## 2017-06-09 ENCOUNTER — Encounter: Payer: Self-pay | Admitting: Nurse Practitioner

## 2017-06-09 DIAGNOSIS — F01518 Vascular dementia, unspecified severity, with other behavioral disturbance: Secondary | ICD-10-CM

## 2017-06-09 DIAGNOSIS — K219 Gastro-esophageal reflux disease without esophagitis: Secondary | ICD-10-CM

## 2017-06-09 DIAGNOSIS — F0151 Vascular dementia with behavioral disturbance: Secondary | ICD-10-CM

## 2017-06-09 DIAGNOSIS — F418 Other specified anxiety disorders: Secondary | ICD-10-CM

## 2017-06-09 NOTE — Assessment & Plan Note (Signed)
restless at times, mostly in the evenings, the patient stated he has history of it, he describes as "chest pain", but not his heart issue, its his indigestion, he feels better after burping, he feels anxious and restless about it. His spouse reported at times the patient becomes anxious and stressed concerning his health and breaks out in sweat. 06/09/17 Lorazepam 0.5mg  x1 effective. He was started on Sertraline 25mg  po daily since 05/18/17, not adequate to stabilize his mood,  Will increase Sertraline 50mg /25mg  po daily, have Lorazepam 0.5mg  q6h prn available to him.

## 2017-06-09 NOTE — Progress Notes (Signed)
Location:  [facility] Room Number: 41 Place of Service:  SNF (31) Provider:  Lennie Odor Dariann Huckaba NP  Blanchie Serve, MD  Patient Care Team: Blanchie Serve, MD as PCP - General (Internal Medicine) Deni Lefever X, NP as Nurse Practitioner (Internal Medicine)  Extended Emergency Contact Information Primary Emergency Contact: [facility] Address: [address] [place]          [place], [place] [address] [place] Phone: [phone] Relation: Spouse Secondary Emergency Contact: Gibson,Sarah Address: [address]          [place], [place] [address] [place] of [place] Phone: [phone] Relation: Other  Code Status:  DNR Goals of care: Advanced Directive information Advanced Directives 06/10/2017  Does Patient Have a Medical Advance Directive? Yes  Type of Advance Directive Out of facility DNR (pink MOST or yellow form)  Does patient want to make changes to medical advance directive? No - Patient declined  Copy of [place] in Chart? -  Pre-existing out of facility DNR order (yellow form or pink MOST form) Yellow form placed in chart (order not valid for inpatient use);Pink MOST form placed in chart (order not valid for inpatient use)     Chief Complaint  Patient presents with  . Acute Visit    medication changes, anxiety, chest discomfort    HPI:  Pt is a Derrick Levy seen today for an acute visit for anxious and restless at times, mostly in the evenings, the patient stated he has history of it, he describes as "chest pain", but not his heart issue, its his indigestion, he feels better after burping, he feels anxious and restless about it. His spouse reported at times the patient becomes anxious and stressed concerning his health and breaks out in sweat. 06/09/17 Lorazepam 0.60m x1 effective. He was started on Sertraline 254mpo daily since 05/18/17, not adequate to stabilize his mood. He started Donepezil 05/21/17  to preserve his memory, he resides in SNF, one person transfer, w/c for mobility.    Past Medical History:  Diagnosis Date  . A-fib ([place]  . Anemia   . Arthritis   . Bandemia   . Benign neoplasm of colon   . Blood glucose elevated   . Chronic thromboembolism of deep veins of proximal leg ([place]  . Corns and callosities   . Dysphagia   . Hyperlipemia   . Hypertension   . Hyposmolality syndrome   . Long term (current) use of anticoagulants   . Malignant neoplasm of prostate ([facility]  . Memory loss   . Multiple myeloma in remission ([facility]  . Pacemaker   . Post-traumatic osteoarthritis of left hip    Past Surgical History:  Procedure Laterality Date  . EYE SURGERY     cataract surgery  . FRACTURE SURGERY     left hemiarthroplasty 10/15/13 Dr. KeAlfredia Client. HEMORRHOID SURGERY    . JOINT REPLACEMENT     right knee replacement by Dr. WeOnnie Graham. PACEMAKER PLACEMENT    . PROSTATE SURGERY      Allergies  Allergen Reactions  . Codeine   . Hctz [Hydrochlorothiazide]   . Lipitor [Atorvastatin]   . Quinine Derivatives   . Sulfa Antibiotics     Outpatient Encounter Medications as of 06/09/2017  Medication Sig  . acetaminophen (TYLENOL) 325 MG tablet Take 325 mg by mouth at bedtime as needed.   . Marland Kitchencetaminophen (TYLENOL) 325 MG tablet  Take 650 mg by mouth every 6 (six) hours as needed.  Marland Kitchen acyclovir (ZOVIRAX) 400 MG tablet Take 400 mg by mouth daily.  . calcium carbonate (OSCAL) 1500 (600 Ca) MG TABS tablet Take 600 mg of elemental calcium by mouth daily.  Marland Kitchen dexamethasone (DECADRON) 2 MG tablet Take 10 tablets (20 mg total) by mouth once a week.  . donepezil (ARICEPT) 10 MG tablet Take 10 mg by mouth at bedtime.  . ferrous sulfate 325 (65 FE) MG EC tablet Take 325 mg by mouth daily with breakfast.  . furosemide (LASIX) 20 MG tablet Take 20 mg by mouth daily.  . furosemide (LASIX) 40 MG tablet Take 40 mg by mouth daily.  Marland Kitchen ipratropium-albuterol (DUONEB) 0.5-2.5 (3) MG/3ML SOLN  Take 3 mLs every 8 (eight) hours as needed by nebulization (times 72 hours).  . Menthol, Topical Analgesic, (BIOFREEZE) 4 % GEL Apply 1 application topically daily as needed.  . metoprolol tartrate (LOPRESSOR) 25 MG tablet Take 12.5 mg by mouth daily.   . MULTIPLE VITAMIN PO Take 1 tablet by mouth daily.  . nitroGLYCERIN (NITROSTAT) 0.4 MG SL tablet Place 0.4 mg every 5 (five) minutes as needed under the tongue for chest pain.  . Polyvinyl Alcohol-Povidone (REFRESH OP) Apply 1 drop to eye as needed.  . potassium chloride SA (K-DUR,KLOR-CON) 20 MEQ tablet Take 20 mEq by mouth daily.  Marland Kitchen senna-docusate (SENOKOT-S) 8.6-50 MG per tablet Take 1 tablet by mouth daily as needed.   . sertraline (ZOLOFT) 25 MG tablet Take 25 mg by mouth daily.  . simvastatin (ZOCOR) 10 MG tablet Take 10 mg by mouth at bedtime.   Marland Kitchen warfarin (COUMADIN) 2 MG tablet Take 2 mg by mouth one time only at 6 PM.  . [DISCONTINUED] amoxicillin (AMOXIL) 500 MG capsule Take 2,000 mg by mouth once as needed (Prior to dental appointment).   No facility-administered encounter medications on file as of 06/09/2017.     Review of Systems  Constitutional: Negative for activity change, appetite change, chills, diaphoresis, fatigue and fever.  HENT: Positive for hearing loss. Negative for congestion, trouble swallowing and voice change.   Eyes: Negative for visual disturbance.  Respiratory: Negative for cough, choking, chest tightness, shortness of breath and wheezing.        C/o indigestion in his words "chest pain"  Cardiovascular: Positive for leg swelling. Negative for chest pain and palpitations.  Gastrointestinal: Negative for abdominal distention, abdominal pain, constipation, diarrhea, nausea and vomiting.  Endocrine: Negative for cold intolerance.  Genitourinary: Negative for difficulty urinating, dysuria, frequency and urgency.  Musculoskeletal: Positive for gait problem. Negative for arthralgias, back pain and joint swelling.    Skin: Negative for color change, pallor, rash and wound.  Neurological: Negative for tremors, seizures, speech difficulty, weakness and headaches.       Dementia  Psychiatric/Behavioral: Positive for agitation, behavioral problems and confusion. Negative for hallucinations and sleep disturbance. The patient is nervous/anxious.     Immunization History  Administered Date(s) Administered  . Influenza, High Dose Seasonal PF 03/05/2017  . Pneumococcal Conjugate-13 04/08/2000  . Pneumococcal Polysaccharide-23 12/07/2013  . Td 11/11/2012  . Tdap 08/23/2001  . Zoster 06/05/2011   Pertinent  Health Maintenance Due  Topic Date Due  . INFLUENZA VACCINE  Completed  . PNA vac Low Risk Adult  Completed   Fall Risk  06/10/2017  Falls in the past year? Yes  Number falls in past yr: 2 or more  Injury with Fall? No   Functional Status Survey:  Vitals:   06/09/17 1503  BP: 112/68  Pulse: 68  Resp: 18  Temp: 98.1 F (36.7 C)  SpO2: 93%  Weight: 140 lb (63.5 kg)  Height: 5' 9"  (1.753 m)   Body mass index is 20.67 kg/m. Physical Exam  Constitutional: He appears well-developed and well-nourished.  HENT:  Head: Normocephalic and atraumatic.  Eyes: Conjunctivae and EOM are normal. Pupils are equal, round, and reactive to light.  Neck: Normal range of motion. Neck supple. No JVD present. No thyromegaly present.  Cardiovascular: Normal rate and regular rhythm.  Murmur heard. Pulmonary/Chest: Effort normal and breath sounds normal. He has no wheezes. He has no rales.  Abdominal: Soft. Bowel sounds are normal. He exhibits no distension. There is no tenderness. There is no rebound and no guarding.  Musculoskeletal: Normal range of motion. He exhibits edema. He exhibits no tenderness.  Trace edema BLE, one person transfer, w/c for mobility  Neurological: He is alert. He exhibits normal muscle tone. Coordination normal.  Oriented to person and place.   Skin: Skin is warm and dry. No rash  noted. No erythema. No pallor.  Psychiatric:  Anxious facial looks    Labs reviewed: Recent Labs    03/30/17 04/21/17 1108 05/04/17 05/19/17  NA 138 138 134* 137  K 4.4 3.7 3.9 4.2  CL  --  102  --   --   CO2  --  29  --   --   GLUCOSE  --  110*  --   --   BUN 19 28* 23* 29*  CREATININE 0.7 0.86 0.8  --   CALCIUM  --  8.9  --  9.0   Recent Labs    03/09/17 03/18/17 05/19/17  AST 21 16 24   ALT 29 20 25   ALKPHOS 79 62 73  BILITOT  --   --  1.1  PROT  --   --  5.5  ALBUMIN  --   --  3.5   Recent Labs    03/18/17 03/23/17 04/21/17 1108 05/19/17  WBC 11.4  --  13.2* 24  NEUTROABS  --   --  10.0*  --   HGB 13.0* 12.9* 12.9* 12.4  HCT 39* 38* 40.2 36.6  MCV  --   --  92.4  --   PLT 172 229 191  --    No results found for: TSH No results found for: HGBA1C Lab Results  Component Value Date   CHOL 141 05/18/2017   HDL 57 05/18/2017   LDLCALC 74 05/18/2017   TRIG 36 (A) 05/18/2017    Significant Diagnostic Results in last 30 days:  No results found.  Assessment/Plan Situational anxiety restless at times, mostly in the evenings, the patient stated he has history of it, he describes as "chest pain", but not his heart issue, its his indigestion, he feels better after burping, he feels anxious and restless about it. His spouse reported at times the patient becomes anxious and stressed concerning his health and breaks out in sweat. 06/09/17 Lorazepam 0.42m x1 effective. He was started on Sertraline 230mpo daily since 05/18/17, not adequate to stabilize his mood,  Will increase Sertraline 5080m5mg po daily, have Lorazepam 0.5mg84mh prn available to him.   Dementia with behavioral disturbance 05/24/17 MMSE 20/30, no clock drawing. Continue Donepezil   GERD (gastroesophageal reflux disease) Will add Omeprazole 20mg54mly, observe      Family/ staff Communication: plan of care reviewed with the patient and charge nurse.   Labs/tests  ordered:  None  Time spend 25  minutes.

## 2017-06-09 NOTE — Assessment & Plan Note (Signed)
Will add Omeprazole 20mg  daily, observe

## 2017-06-09 NOTE — Assessment & Plan Note (Signed)
05/24/17 MMSE 20/30, no clock drawing. Continue Donepezil

## 2017-06-10 ENCOUNTER — Non-Acute Institutional Stay (SKILLED_NURSING_FACILITY): Payer: Medicare Other

## 2017-06-10 DIAGNOSIS — Z0000 Encounter for general adult medical examination without abnormal findings: Secondary | ICD-10-CM | POA: Diagnosis not present

## 2017-06-10 NOTE — Progress Notes (Signed)
Subjective:   Derrick Levy is a 83 y.o. male who presents for Medicare Annual/Subsequent preventive examination at [place]  Last AWV-09/2011    Objective:    Vitals: BP 122/70 (BP Location: Left Arm, Patient Position: Sitting)   Pulse (!) 59   Temp 97.8 F (36.6 C) (Oral)   Ht 5' 9"  (1.753 m)   Wt 141 lb (64 kg)   SpO2 96%   BMI 20.82 kg/m   Body mass index is 20.82 kg/m.  Advanced Directives 06/10/2017 06/01/2017 04/28/2017 04/21/2017 04/20/2017 04/13/2017 04/05/2017  Does Patient Have a Medical Advance Directive? Yes Yes Yes Yes Yes Yes Yes  Type of Advance Directive Out of facility DNR (pink MOST or yellow form) Out of facility DNR (pink MOST or yellow form) [place];Living will;Out of facility DNR (pink MOST or yellow form) [place];Living will;Out of facility DNR (pink MOST or yellow form) Out of facility DNR (pink MOST or yellow form) Out of facility DNR (pink MOST or yellow form) Out of facility DNR (pink MOST or yellow form)  Does patient want to make changes to medical advance directive? No - Patient declined - No - Patient declined - No - Patient declined No - Patient declined No - Patient declined  Copy of [place] in Chart? - - Yes - - - -  Pre-existing out of facility DNR order (yellow form or pink MOST form) Yellow form placed in chart (order not valid for inpatient use);Pink MOST form placed in chart (order not valid for inpatient use) Pink MOST form placed in chart (order not valid for inpatient use);Yellow form placed in chart (order not valid for inpatient use) Yellow form placed in chart (order not valid for inpatient use) - Yellow form placed in chart (order not valid for inpatient use) Yellow form placed in chart (order not valid for inpatient use) Yellow form placed in chart (order not valid for inpatient use)    Tobacco Social History   Tobacco Use  Smoking Status Never Smoker   Smokeless Tobacco Never Used     Counseling given: Not Answered   Clinical Intake:  Pre-visit preparation completed: No  Pain : No/denies pain     Nutritional Status: BMI of 19-24  Normal Nutritional Risks: None Diabetes: No  How often do you need to have someone help you when you read instructions, pamphlets, or other written materials from your doctor or pharmacy?: 3 - Sometimes  Interpreter Needed?: No  Information entered by :: Tyson Dense, RN  Past Medical History:  Diagnosis Date  . A-fib ([place])   . Anemia   . Arthritis   . Bandemia   . Benign neoplasm of colon   . Blood glucose elevated   . Chronic thromboembolism of deep veins of proximal leg ([place])   . Corns and callosities   . Dysphagia   . Hyperlipemia   . Hypertension   . Hyposmolality syndrome   . Long term (current) use of anticoagulants   . Malignant neoplasm of prostate ([place])   . Memory loss   . Multiple myeloma in remission ([place])   . Pacemaker   . Post-traumatic osteoarthritis of left hip    Past Surgical History:  Procedure Laterality Date  . EYE SURGERY     cataract surgery  . FRACTURE SURGERY     left hemiarthroplasty 10/15/13 Dr. Alfredia Client  . HEMORRHOID SURGERY    . JOINT REPLACEMENT  right knee replacement by Dr. Onnie Graham  . PACEMAKER PLACEMENT    . PROSTATE SURGERY     Family History  Problem Relation Age of Onset  . Hypertension Unknown   . Bladder Cancer Unknown   . Prostate cancer Unknown    Social History   Socioeconomic History  . Marital status: Married    Spouse name: None  . Number of children: None  . Years of education: None  . Highest education level: None  Social Needs  . Financial resource strain: Not hard at all  . Food insecurity - worry: Never true  . Food insecurity - inability: Never true  . Transportation needs - medical: No  . Transportation needs - non-medical: No  Occupational History  . None  Tobacco Use  . Smoking status: Never Smoker    . Smokeless tobacco: Never Used  Substance and Sexual Activity  . Alcohol use: No  . Drug use: No  . Sexual activity: None  Other Topics Concern  . None  Social History Narrative  . None    Outpatient Encounter Medications as of 06/10/2017  Medication Sig  . acetaminophen (TYLENOL) 325 MG tablet Take 325 mg by mouth at bedtime as needed.   Marland Kitchen acetaminophen (TYLENOL) 325 MG tablet Take 650 mg by mouth every 6 (six) hours as needed.  Marland Kitchen acyclovir (ZOVIRAX) 400 MG tablet Take 400 mg by mouth daily.  . calcium carbonate (OSCAL) 1500 (600 Ca) MG TABS tablet Take 600 mg of elemental calcium by mouth daily.  Marland Kitchen dexamethasone (DECADRON) 2 MG tablet Take 10 tablets (20 mg total) by mouth once a week.  . donepezil (ARICEPT) 10 MG tablet Take 10 mg by mouth at bedtime.  . ferrous sulfate 325 (65 FE) MG EC tablet Take 325 mg by mouth daily with breakfast.  . furosemide (LASIX) 20 MG tablet Take 20 mg by mouth daily.  . furosemide (LASIX) 40 MG tablet Take 40 mg by mouth daily.  Marland Kitchen ipratropium-albuterol (DUONEB) 0.5-2.5 (3) MG/3ML SOLN Take 3 mLs every 8 (eight) hours as needed by nebulization (times 72 hours).  . Menthol, Topical Analgesic, (BIOFREEZE) 4 % GEL Apply 1 application topically daily as needed.  . metoprolol tartrate (LOPRESSOR) 25 MG tablet Take 12.5 mg by mouth daily.   . MULTIPLE VITAMIN PO Take 1 tablet by mouth daily.  . nitroGLYCERIN (NITROSTAT) 0.4 MG SL tablet Place 0.4 mg every 5 (five) minutes as needed under the tongue for chest pain.  . Polyvinyl Alcohol-Povidone (REFRESH OP) Apply 1 drop to eye as needed.  . potassium chloride SA (K-DUR,KLOR-CON) 20 MEQ tablet Take 20 mEq by mouth daily.  Marland Kitchen senna-docusate (SENOKOT-S) 8.6-50 MG per tablet Take 1 tablet by mouth daily as needed.   . sertraline (ZOLOFT) 25 MG tablet Take 25 mg by mouth daily.  . simvastatin (ZOCOR) 10 MG tablet Take 10 mg by mouth at bedtime.   Marland Kitchen warfarin (COUMADIN) 2 MG tablet Take 2 mg by mouth one time only  at 6 PM.   No facility-administered encounter medications on file as of 06/10/2017.     Activities of Daily Living In your present state of health, do you have any difficulty performing the following activities: 06/10/2017  Hearing? Y  Vision? N  Difficulty concentrating or making decisions? Y  Walking or climbing stairs? Y  Dressing or bathing? Y  Doing errands, shopping? Y  Preparing Food and eating ? Y  Using the Toilet? Y  In the past six months, have you  accidently leaked urine? Y  Do you have problems with loss of bowel control? Y  Managing your Medications? Y  Managing your Finances? Y  Housekeeping or managing your Housekeeping? Y  Some recent data might be hidden    Patient Care Team: Blanchie Serve, MD as PCP - General (Internal Medicine) Mast, Man X, NP as Nurse Practitioner (Internal Medicine)   Assessment:   This is a routine wellness examination for [place].  Exercise Activities and Dietary recommendations Current Exercise Habits: The patient does not participate in regular exercise at present, Exercise limited by: orthopedic condition(s)  Goals    None      Fall Risk Fall Risk  06/10/2017  Falls in the past year? Yes  Number falls in past yr: 2 or more  Injury with Fall? No   Is the patient's home free of loose throw rugs in walkways, pet beds, electrical cords, etc?   yes      Grab bars in the bathroom? yes      Handrails on the stairs?   yes      Adequate lighting?   yes  Timed Get Up and Go Performed: unable to perform without assistance  Depression Screen PHQ 2/9 Scores 06/10/2017  PHQ - 2 Score 0    Cognitive Function MMSE - Mini Mental State Exam 05/26/2017 05/26/2017  Orientation to time 2 2  Orientation to Place 5 5  Registration 3 3  Attention/ Calculation 0 0  Recall 2 2  Language- name 2 objects 2 2  Language- repeat 1 1  Language- follow 3 step command 3 3  Language- read & follow direction 1 1  Write a sentence 0 0  Copy  design 1 1  Total score 20 20        Immunization History  Administered Date(s) Administered  . Influenza, High Dose Seasonal PF 03/05/2017  . Pneumococcal Conjugate-13 04/08/2000  . Pneumococcal Polysaccharide-23 12/07/2013  . Td 11/11/2012  . Tdap 08/23/2001  . Zoster 06/05/2011    Qualifies for Shingles Vaccine? Yes, educated. Will check with oncologist for recommendation  Screening Tests Health Maintenance  Topic Date Due  . TETANUS/TDAP  11/12/2022  . INFLUENZA VACCINE  Completed  . PNA vac Low Risk Adult  Completed   Cancer Screenings: Lung: Low Dose CT Chest recommended if Age 50-80 years, 30 pack-year currently smoking OR have quit w/in 15years. Patient does not qualify. Colorectal: up to date  Additional Screenings:  Hepatitis B/HIV/Syphillis:Not indicated Hepatitis C Screening: Not indicated    Plan:    I have personally reviewed and addressed the Medicare Annual Wellness questionnaire and have noted the following in the patient's chart:  A. Medical and social history B. Use of alcohol, tobacco or illicit drugs  C. Current medications and supplements D. Functional ability and status E.  Nutritional status F.  Physical activity G. Advance directives H. List of other physicians I.  Hospitalizations, surgeries, and ER visits in previous 12 months J.  [place] to include hearing, vision, cognitive, depression L. Referrals and appointments - none  In addition, I have reviewed and discussed with patient certain preventive protocols, quality metrics, and best practice recommendations. A written personalized care plan for preventive services as well as general preventive health recommendations were provided to patient.  See attached scanned questionnaire for additional information.   Signed,   Tyson Dense, RN Nurse Health Advisor   Quick Notes   Health Maintenance: Shingrix due-pt and wife will discuss with oncologist  Abnormal  Screen: MMSE 05/26/2017 20/30     Patient Concerns: none     Nurse Concerns: none

## 2017-06-10 NOTE — Patient Instructions (Addendum)
Derrick Levy , Thank you for taking time to come for your Medicare Wellness Visit. I appreciate your ongoing commitment to your health goals. Please review the following plan we discussed and let me know if I can assist you in the future.   Screening recommendations/referrals: Colonoscopy excluded, pt is over age 81 Recommended yearly ophthalmology/optometry visit for glaucoma screening and checkup Recommended yearly dental visit for hygiene and checkup  Vaccinations: Influenza vaccine up to date. Due 2019 fall season Pneumococcal vaccine up to date Tdap vaccine up to date. Due 11/12/2022 Shingles vaccine due, pt and wife will discuss with oncologist for recommendation    Advanced directives: in chart  Conditions/risks identified: none  Next appointment: Dr. Bubba Camp makes rounds  Preventive Care 9 Years and Older, Male Preventive care refers to lifestyle choices and visits with your health care provider that can promote health and wellness. What does preventive care include?  A yearly physical exam. This is also called an annual well check.  Dental exams once or twice a year.  Routine eye exams. Ask your health care provider how often you should have your eyes checked.  Personal lifestyle choices, including:  Daily care of your teeth and gums.  Regular physical activity.  Eating a healthy diet.  Avoiding tobacco and drug use.  Limiting alcohol use.  Practicing safe sex.  Taking low doses of aspirin every day.  Taking vitamin and mineral supplements as recommended by your health care provider. What happens during an annual well check? The services and screenings done by your health care provider during your annual well check will depend on your age, overall health, lifestyle risk factors, and family history of disease. Counseling  Your health care provider may ask you questions about your:  Alcohol use.  Tobacco use.  Drug use.  Emotional well-being.  Home and  relationship well-being.  Sexual activity.  Eating habits.  History of falls.  Memory and ability to understand (cognition).  Work and work Statistician. Screening  You may have the following tests or measurements:  Height, weight, and BMI.  Blood pressure.  Lipid and cholesterol levels. These may be checked every 5 years, or more frequently if you are over 3 years old.  Skin check.  Lung cancer screening. You may have this screening every year starting at age 37 if you have a 30-pack-year history of smoking and currently smoke or have quit within the past 15 years.  Fecal occult blood test (FOBT) of the stool. You may have this test every year starting at age 69.  Flexible sigmoidoscopy or colonoscopy. You may have a sigmoidoscopy every 5 years or a colonoscopy every 10 years starting at age 3.  Prostate cancer screening. Recommendations will vary depending on your family history and other risks.  Hepatitis C blood test.  Hepatitis B blood test.  Sexually transmitted disease (STD) testing.  Diabetes screening. This is done by checking your blood sugar (glucose) after you have not eaten for a while (fasting). You may have this done every 1-3 years.  Abdominal aortic aneurysm (AAA) screening. You may need this if you are a current or former smoker.  Osteoporosis. You may be screened starting at age 16 if you are at high risk. Talk with your health care provider about your test results, treatment options, and if necessary, the need for more tests. Vaccines  Your health care provider may recommend certain vaccines, such as:  Influenza vaccine. This is recommended every year.  Tetanus, diphtheria, and acellular pertussis (  Tdap, Td) vaccine. You may need a Td booster every 10 years.  Zoster vaccine. You may need this after age 13.  Pneumococcal 13-valent conjugate (PCV13) vaccine. One dose is recommended after age 62.  Pneumococcal polysaccharide (PPSV23) vaccine. One  dose is recommended after age 76. Talk to your health care provider about which screenings and vaccines you need and how often you need them. This information is not intended to replace advice given to you by your health care provider. Make sure you discuss any questions you have with your health care provider. Document Released: 07/05/2015 Document Revised: 02/26/2016 Document Reviewed: 04/09/2015 Elsevier Interactive Patient Education  2017 [place] Prevention in the Home Falls can cause injuries. They can happen to people of all ages. There are many things you can do to make your home safe and to help prevent falls. What can I do on the outside of my home?  Regularly fix the edges of walkways and driveways and fix any cracks.  Remove anything that might make you trip as you walk through a door, such as a raised step or threshold.  Trim any bushes or trees on the path to your home.  Use bright outdoor lighting.  Clear any walking paths of anything that might make someone trip, such as rocks or tools.  Regularly check to see if handrails are loose or broken. Make sure that both sides of any steps have handrails.  Any raised decks and porches should have guardrails on the edges.  Have any leaves, snow, or ice cleared regularly.  Use sand or salt on walking paths during winter.  Clean up any spills in your garage right away. This includes oil or grease spills. What can I do in the bathroom?  Use night lights.  Install grab bars by the toilet and in the tub and shower. Do not use towel bars as grab bars.  Use non-skid mats or decals in the tub or shower.  If you need to sit down in the shower, use a plastic, non-slip stool.  Keep the floor dry. Clean up any water that spills on the floor as soon as it happens.  Remove soap buildup in the tub or shower regularly.  Attach bath mats securely with double-sided non-slip rug tape.  Do not have throw rugs and other  things on the floor that can make you trip. What can I do in the bedroom?  Use night lights.  Make sure that you have a light by your bed that is easy to reach.  Do not use any sheets or blankets that are too big for your bed. They should not hang down onto the floor.  Have a firm chair that has side arms. You can use this for support while you get dressed.  Do not have throw rugs and other things on the floor that can make you trip. What can I do in the kitchen?  Clean up any spills right away.  Avoid walking on wet floors.  Keep items that you use a lot in easy-to-reach places.  If you need to reach something above you, use a strong step stool that has a grab bar.  Keep electrical cords out of the way.  Do not use floor polish or wax that makes floors slippery. If you must use wax, use non-skid floor wax.  Do not have throw rugs and other things on the floor that can make you trip. What can I do with my stairs?  Do  not leave any items on the stairs.  Make sure that there are handrails on both sides of the stairs and use them. Fix handrails that are broken or loose. Make sure that handrails are as long as the stairways.  Check any carpeting to make sure that it is firmly attached to the stairs. Fix any carpet that is loose or worn.  Avoid having throw rugs at the top or bottom of the stairs. If you do have throw rugs, attach them to the floor with carpet tape.  Make sure that you have a light switch at the top of the stairs and the bottom of the stairs. If you do not have them, ask someone to add them for you. What else can I do to help prevent falls?  Wear shoes that:  Do not have high heels.  Have rubber bottoms.  Are comfortable and fit you well.  Are closed at the toe. Do not wear sandals.  If you use a stepladder:  Make sure that it is fully opened. Do not climb a closed stepladder.  Make sure that both sides of the stepladder are locked into place.  Ask  someone to hold it for you, if possible.  Clearly mark and make sure that you can see:  Any grab bars or handrails.  First and last steps.  Where the edge of each step is.  Use tools that help you move around (mobility aids) if they are needed. These include:  Canes.  Walkers.  Scooters.  Crutches.  Turn on the lights when you go into a dark area. Replace any light bulbs as soon as they burn out.  Set up your furniture so you have a clear path. Avoid moving your furniture around.  If any of your floors are uneven, fix them.  If there are any pets around you, be aware of where they are.  Review your medicines with your doctor. Some medicines can make you feel dizzy. This can increase your chance of falling. Ask your doctor what other things that you can do to help prevent falls. This information is not intended to replace advice given to you by your health care provider. Make sure you discuss any questions you have with your health care provider. Document Released: 04/04/2009 Document Revised: 11/14/2015 Document Reviewed: 07/13/2014 Elsevier Interactive Patient Education  2017 [employer].

## 2017-06-14 LAB — PROTIME-INR: Protime: 30.3 — AB (ref 10.0–13.8)

## 2017-06-14 LAB — POCT INR: INR: 2.9 — AB (ref <=1.1)

## 2017-06-16 ENCOUNTER — Other Ambulatory Visit: Payer: Self-pay | Admitting: *Deleted

## 2017-06-18 ENCOUNTER — Non-Acute Institutional Stay (SKILLED_NURSING_FACILITY): Payer: Medicare Other | Admitting: Nurse Practitioner

## 2017-06-18 ENCOUNTER — Encounter: Payer: Self-pay | Admitting: Nurse Practitioner

## 2017-06-18 DIAGNOSIS — D4989 Neoplasm of unspecified behavior of other specified sites: Secondary | ICD-10-CM | POA: Diagnosis not present

## 2017-06-18 DIAGNOSIS — F0391 Unspecified dementia with behavioral disturbance: Secondary | ICD-10-CM

## 2017-06-18 DIAGNOSIS — G609 Hereditary and idiopathic neuropathy, unspecified: Secondary | ICD-10-CM | POA: Diagnosis not present

## 2017-06-18 DIAGNOSIS — H6121 Impacted cerumen, right ear: Secondary | ICD-10-CM

## 2017-06-18 DIAGNOSIS — I480 Paroxysmal atrial fibrillation: Secondary | ICD-10-CM

## 2017-06-18 DIAGNOSIS — I5040 Unspecified combined systolic (congestive) and diastolic (congestive) heart failure: Secondary | ICD-10-CM | POA: Diagnosis not present

## 2017-06-18 DIAGNOSIS — R791 Abnormal coagulation profile: Secondary | ICD-10-CM

## 2017-06-18 DIAGNOSIS — F418 Other specified anxiety disorders: Secondary | ICD-10-CM

## 2017-06-18 DIAGNOSIS — K219 Gastro-esophageal reflux disease without esophagitis: Secondary | ICD-10-CM | POA: Diagnosis not present

## 2017-06-18 NOTE — Assessment & Plan Note (Signed)
depression/anxiety, Sertraline was increased to 50mg  06/09/17, he stated he slept well last couple of nights, no prn Lorazepam used in the past 2-3 days. observe

## 2017-06-18 NOTE — Assessment & Plan Note (Signed)
CHF compensated, trace edema in ankles, continue Furosemide 60mg  qd

## 2017-06-18 NOTE — Progress Notes (Signed)
Location:   [facility] Room Number: 68 Place of Service:  SNF (31) Provider: Lennie Odor Zamauri Nez NP  Blanchie Serve, MD  Patient Care Team: Blanchie Serve, MD as PCP - General (Internal Medicine) Arnetra Terris X, NP as Nurse Practitioner (Internal Medicine)  Extended Emergency Contact Information Primary Emergency Contact: [facility] Address: [address] [place]          [place], [place] [address] [place] Phone: [phone] Relation: Spouse Secondary Emergency Contact: Gibson,Sarah Address: [address]          [place], [place] [address] Johnnette Litter of [place] Phone: [phone] Relation: Other  Code Status:  DNR Goals of care: Advanced Directive information Advanced Directives 06/10/2017  Does Patient Have a Medical Advance Directive? Yes  Type of Advance Directive Out of facility DNR (pink MOST or yellow form)  Does patient want to make changes to medical advance directive? No - Patient declined  Copy of [place] in Chart? -  Pre-existing out of facility DNR order (yellow form or pink MOST form) Yellow form placed in chart (order not valid for inpatient use);Pink MOST form placed in chart (order not valid for inpatient use)     Chief Complaint  Patient presents with  . Medical Management of Chronic Issues    HPI:  Pt is a 83 y.o. male seen today for medical management of chronic diseases.     The patient has depression/anxiety, Sertraline was increased to '50mg'$  06/09/17, he stated he slept well last couple of nights, no prn Lorazepam used in the past 2-3 days. No c/o "chest pain", mostly occurred at night, since Omeprazole '20mg'$  06/09/17. He has peripheral neuropathy, ambulates with walker, wheelchair to go further. He takes Coumadin for thromboembolic risk reduction related to Afib, his heart rate is in control while on Metoprolol 12.'5mg'$  bid, CHF compensated on Furosemide '60mg'$  qd, anemia, on Fe, last Hgb 11 06/03/17. His  memory is preserved on Donepezil '10mg'$  daily.    Past Medical History:  Diagnosis Date  . A-fib ([place])   . Anemia   . Arthritis   . Bandemia   . Benign neoplasm of colon   . Blood glucose elevated   . 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 Corns and callosities   . Dysphagia   . Hyperlipemia   . Hypertension   . Hyposmolality syndrome   . Long term (current) use of anticoagulants   . Malignant neoplasm of prostate ([place])   . Memory loss   . Multiple myeloma in remission ([place])   . Pacemaker   . Post-traumatic osteoarthritis of left hip    Past Surgical History:  Procedure Laterality Date  . EYE SURGERY     cataract surgery  . FRACTURE SURGERY     left hemiarthroplasty 10/15/13 Dr. Alfredia Client  . HEMORRHOID SURGERY    . JOINT REPLACEMENT     right knee replacement by Dr. Onnie Graham  . PACEMAKER PLACEMENT    . PROSTATE SURGERY      Allergies  Allergen Reactions  . Codeine   . Hctz [Hydrochlorothiazide]   . Lipitor [Atorvastatin]   . Quinine Derivatives   . Sulfa Antibiotics     Allergies as of 06/18/2017      Reactions   Codeine    Hctz [hydrochlorothiazide]    Lipitor [atorvastatin]    Quinine Derivatives    Sulfa Antibiotics       Medication List  Accurate as of 06/18/17  1:28 PM. Always use your most recent med list.          acetaminophen 325 MG tablet Commonly known as:  TYLENOL Take 325 mg by mouth at bedtime as needed.   acetaminophen 325 MG tablet Commonly known as:  TYLENOL Take 650 mg by mouth every 6 (six) hours as needed.   acyclovir 400 MG tablet Commonly known as:  ZOVIRAX Take 400 mg by mouth daily.   BIOFREEZE 4 % Gel Generic drug:  Menthol (Topical Analgesic) Apply 1 application topically daily as needed.   calcium carbonate 1500 (600 Ca) MG Tabs tablet Commonly known as:  OSCAL Take 600 mg of elemental calcium by mouth daily.   dexamethasone 2 MG tablet Commonly known as:  DECADRON Take 10  tablets (20 mg total) by mouth once a week.   donepezil 10 MG tablet Commonly known as:  ARICEPT Take 10 mg by mouth at bedtime.   ferrous sulfate 325 (65 FE) MG EC tablet Take 325 mg by mouth daily with breakfast.   furosemide 20 MG tablet Commonly known as:  LASIX Take 20 mg by mouth daily.   furosemide 40 MG tablet Commonly known as:  LASIX Take 40 mg by mouth daily.   ipratropium-albuterol 0.5-2.5 (3) MG/3ML Soln Commonly known as:  DUONEB Take 3 mLs every 8 (eight) hours as needed by nebulization (times 72 hours).   metoprolol tartrate 25 MG tablet Commonly known as:  LOPRESSOR Take 12.5 mg by mouth daily.   MULTIPLE VITAMIN PO Take 1 tablet by mouth daily.   nitroGLYCERIN 0.4 MG SL tablet Commonly known as:  NITROSTAT Place 0.4 mg every 5 (five) minutes as needed under the tongue for chest pain.   potassium chloride SA 20 MEQ tablet Commonly known as:  K-DUR,KLOR-CON Take 20 mEq by mouth daily.   REFRESH OP Apply 1 drop to eye as needed.   senna-docusate 8.6-50 MG tablet Commonly known as:  Senokot-S Take 1 tablet by mouth daily as needed.   sertraline 25 MG tablet Commonly known as:  ZOLOFT Take 25 mg by mouth daily.   simvastatin 10 MG tablet Commonly known as:  ZOCOR Take 10 mg by mouth at bedtime.   warfarin 2 MG tablet Commonly known as:  COUMADIN Take 2 mg by mouth one time only at 6 PM.      ROS was provided with assistance of the patient's wife and staff Review of Systems  Constitutional: Negative for activity change, appetite change, chills, diaphoresis and fatigue.  HENT: Positive for hearing loss. Negative for congestion, trouble swallowing and voice change.   Eyes: Negative for visual disturbance.  Respiratory: Negative for cough, choking, chest tightness, shortness of breath and wheezing.   Cardiovascular: Positive for leg swelling. Negative for chest pain and palpitations.  Gastrointestinal: Negative for abdominal distention,  abdominal pain and anal bleeding.  Endocrine: Negative for cold intolerance.  Genitourinary: Negative for difficulty urinating, dysuria and frequency.  Musculoskeletal: Positive for gait problem.  Skin: Negative for color change, pallor and rash.  Neurological: Negative for tremors, speech difficulty, weakness and headaches.       Dementia, peripheral neuropathy/leg weakness  Psychiatric/Behavioral: Positive for confusion. Negative for agitation, behavioral problems, hallucinations and sleep disturbance. The patient is not nervous/anxious.     Immunization History  Administered Date(s) Administered  . Influenza, High Dose Seasonal PF 03/05/2017  . Pneumococcal Conjugate-13 04/08/2000  . Pneumococcal Polysaccharide-23 12/07/2013  . Td 11/11/2012  . Tdap 08/23/2001  .  Zoster 06/05/2011   Pertinent  Health Maintenance Due  Topic Date Due  . INFLUENZA VACCINE  Completed  . PNA vac Low Risk Adult  Completed   Fall Risk  06/10/2017  Falls in the past year? Yes  Number falls in past yr: 2 or more  Injury with Fall? No   Functional Status Survey:    There were no vitals filed for this visit. There is no height or weight on file to calculate BMI. Physical Exam  Constitutional: He appears well-developed and well-nourished.  HENT:  Head: Normocephalic and atraumatic.  Impacted ear wax in the right ear  Eyes: Conjunctivae and EOM are normal. Pupils are equal, round, and reactive to light.  Neck: Normal range of motion. Neck supple. No JVD present. No thyromegaly present.  Cardiovascular: Normal rate and regular rhythm.  Murmur heard. Pulmonary/Chest: Effort normal and breath sounds normal. He has no wheezes. He has no rales.  Abdominal: Soft. Bowel sounds are normal. He exhibits no distension. There is no tenderness.  Musculoskeletal: Normal range of motion. He exhibits edema. He exhibits no tenderness.  Trace edema in ankles.   Neurological: He is alert. No cranial nerve deficit.  He exhibits normal muscle tone. Coordination normal.  Oriented to person and place.   Skin: Skin is warm and dry. No rash noted. No erythema.  Psychiatric: He has a normal mood and affect. His behavior is normal. Thought content normal.    Labs reviewed: Recent Labs    03/30/17 04/21/17 1108 05/04/17 05/19/17  NA 138 138 134* 137  K 4.4 3.7 3.9 4.2  CL  --  102  --   --   CO2  --  29  --   --   GLUCOSE  --  110*  --   --   BUN 19 28* 23* 29*  CREATININE 0.7 0.86 0.8  --   CALCIUM  --  8.9  --  9.0   Recent Labs    03/09/17 03/18/17 05/19/17  AST _0 ALT _1 ALKPHOS 79 62 73  BILITOT  --   --  1.1  PROT  --   --  5.5  ALBUMIN  --   --  3.5   Recent Labs    03/18/17 03/23/17 04/21/17 1108 05/19/17  WBC 11.4  --  13.2* 24  NEUTROABS  --   --  10.0*  --   HGB 13.0* 12.9* 12.9* 12.4  HCT 39* 38* 40.2 36.6  MCV  --   --  92.4  --   PLT 172 229 191  --    No results found for: TSH No results found for: HGBA1C Lab Results  Component Value Date   CHOL 141 05/18/2017   HDL 57 05/18/2017   LDLCALC 74 05/18/2017   TRIG 36 (A) 05/18/2017    Significant Diagnostic Results in last 30 days:  No results found.  Assessment/Plan  Supratherapeutic INR 06/17/17 INR 4.3, Coumadin 58m daily held, repeated INR today 06/18/17 2.9, will resume Coumadin 271mqod, repeat PT/INR in one week.   Impacted cerumen of right ear Debrox 5gtt to the right ear bid x 4 days.   Situational anxiety depression/anxiety, Sertraline was increased to 5055m2/19/18, he stated he slept well last couple of nights, no prn Lorazepam used in the past 2-3 days. observe  GERD (gastroesophageal reflux disease) No c/o "chest pain", mostly occurred at night, since Omeprazole 98m61m/19/18. observe  Peripheral neuropathy He has peripheral  neuropathy, ambulates with walker, wheelchair to go further. Weakness in legs.   A-fib He takes Coumadin for thromboembolic risk reduction related to Afib,  his heart rate is in control, continue Metoprolol 12.32m bid.   CHF (congestive heart failure) (HCC) CHF compensated, trace edema in ankles, continue Furosemide 679mqd  Plasma cell neoplasm Off chemo treatment for 8 weeks, f/u oncology, anemia, on Fe, last Hgb 11 06/03/17.  Dementia with behavioral disturbance  His memory is preserved on Donepezil 1035maily.     Family/ staff Communication: plan of care reviewed with the patient and charge nurse  Labs/tests ordered:  none  Time spend 25 minutes

## 2017-06-18 NOTE — Assessment & Plan Note (Signed)
He has peripheral neuropathy, ambulates with walker, wheelchair to go further. Weakness in legs.

## 2017-06-18 NOTE — Assessment & Plan Note (Signed)
No c/o "chest pain", mostly occurred at night, since Omeprazole 20mg  06/09/17. observe

## 2017-06-18 NOTE — Assessment & Plan Note (Signed)
Off chemo treatment for 8 weeks, f/u oncology, anemia, on Fe, last Hgb 11 06/03/17.

## 2017-06-18 NOTE — Assessment & Plan Note (Signed)
06/17/17 INR 4.3, Coumadin 2mg  daily held, repeated INR today 06/18/17 2.9, will resume Coumadin 2mg  qod, repeat PT/INR in one week.

## 2017-06-18 NOTE — Assessment & Plan Note (Signed)
His memory is preserved on Donepezil 10mg  daily.

## 2017-06-18 NOTE — Assessment & Plan Note (Signed)
He takes Coumadin for thromboembolic risk reduction related to Afib, his heart rate is in control, continue Metoprolol 12.5mg  bid.

## 2017-06-18 NOTE — Assessment & Plan Note (Signed)
Debrox 5gtt to the right ear bid x 4 days.

## 2017-06-21 ENCOUNTER — Encounter: Payer: Self-pay | Admitting: Nurse Practitioner

## 2017-06-21 ENCOUNTER — Non-Acute Institutional Stay (SKILLED_NURSING_FACILITY): Payer: Medicare Other | Admitting: Nurse Practitioner

## 2017-06-21 DIAGNOSIS — F418 Other specified anxiety disorders: Secondary | ICD-10-CM | POA: Diagnosis not present

## 2017-06-21 DIAGNOSIS — F0151 Vascular dementia with behavioral disturbance: Secondary | ICD-10-CM | POA: Diagnosis not present

## 2017-06-21 DIAGNOSIS — F01518 Vascular dementia, unspecified severity, with other behavioral disturbance: Secondary | ICD-10-CM

## 2017-06-21 NOTE — Progress Notes (Signed)
Location:  [facility] Room Number: 86 Place of Service:  SNF (31) Provider:  Debra Colon, [place]  NP  Blanchie Serve, MD  Patient Care Team: Blanchie Serve, MD as PCP - General (Internal Medicine) Wilson Sample X, NP as Nurse Practitioner (Internal Medicine)  Extended Emergency Contact Information Primary Emergency Contact: [facility] Address: [address] [place]          [place], [place] [address] [place] Phone: [phone] Relation: Spouse Secondary Emergency Contact: Gibson,Sarah Address: [address]          [place], [place] [address] Johnnette Litter of [place] Phone: [phone] Relation: Other  Code Status:  DNR Goals of care: Advanced Directive information Advanced Directives 06/23/2017  Does Patient Have a Medical Advance Directive? Yes  Type of Advance Directive Out of facility DNR (pink MOST or yellow form)  Does patient want to make changes to medical advance directive? No - Patient declined  Copy of [place] in Chart? -  Pre-existing out of facility DNR order (yellow form or pink MOST form) Yellow form placed in chart (order not valid for inpatient use);Pink MOST form placed in chart (order not valid for inpatient use)     Chief Complaint  Patient presents with  . Acute Visit    medication changes    HPI:  Pt is a 83 y.o. male seen today for an acute visit for Ativan 0.20m q6h prn to be re evaluated for continue needs. In the past 2 weeks, the patient needs 1-2x/day, 06/19/17 reported periods of anxiety/confusion, 06/17/17 reported the patient has concerned when spouse will return. 06/16/17 reported the patient has c/o feeling of impending doom, death, dying, 12019-01-08c/o feeling of impending doom, anxiety, prn Lorazepam is effective. Sertraline was increased to 52m12/19/18.   The patient resides in SNF, transfers self, ambulates with walker and wheelchair to go further, he takes Donepezil 1043mdaily for memory.    Past Medical History:  Diagnosis Date  . A-fib ([facility] . Anemia   . Arthritis   . Bandemia   . Benign neoplasm of colon   . Blood glucose elevated   . Chronic thromboembolism of deep veins of proximal [place] ([place] . Corns and callosities   . Dysphagia   . Hyperlipemia   . Hypertension   . Hyposmolality syndrome   . Long term (current) use of anticoagulants   . Malignant neoplasm of prostate ([facility] . Memory loss   . Multiple myeloma in remission ([facility] . Pacemaker   . Post-traumatic osteoarthritis of left hip    Past Surgical History:  Procedure Laterality Date  . EYE SURGERY     cataract surgery  . FRACTURE SURGERY     left hemiarthroplasty 10/15/13 Dr. KenAlfredia Client HEMORRHOID SURGERY    . JOINT REPLACEMENT     right knee replacement by Dr. WelOnnie Graham PACEMAKER PLACEMENT    . PROSTATE SURGERY      Allergies  Allergen Reactions  . Codeine   . Hctz [Hydrochlorothiazide]   . Lipitor [Atorvastatin]   . Quinine Derivatives   . Sulfa Antibiotics     Outpatient Encounter Medications as of 06/21/2017  Medication Sig  . acetaminophen (TYLENOL) 325 MG tablet Take 325 mg by mouth at bedtime as needed.   . aMarland Kitchenetaminophen (TYLENOL) 325 MG tablet Take 650 mg by mouth every 6 (six) hours as needed.  . aMarland Kitchenyclovir (  ZOVIRAX) 400 MG tablet Take 400 mg by mouth daily.  Marland Kitchen dexamethasone (DECADRON) 2 MG tablet Take 10 tablets (20 mg total) by mouth once a week.  . ferrous sulfate 325 (65 FE) MG EC tablet Take 325 mg by mouth daily with breakfast.  . furosemide (LASIX) 20 MG tablet Take 20 mg by mouth daily.  . [EXPIRED] LORazepam (ATIVAN) 0.5 MG tablet Take 0.5 mg by mouth every 6 (six) hours as needed for anxiety.  . Menthol, Topical Analgesic, (BIOFREEZE) 4 % GEL Apply 1 application topically daily as needed.  . metoprolol tartrate (LOPRESSOR) 25 MG tablet Take 12.5 mg by mouth daily.   . MULTIPLE VITAMIN PO Take 1 tablet by mouth daily.  . nitroGLYCERIN  (NITROSTAT) 0.4 MG SL tablet Place 0.4 mg every 5 (five) minutes as needed under the tongue for chest pain.  . Polyvinyl Alcohol-Povidone (REFRESH OP) Apply 1 drop to eye as needed.  . potassium chloride SA (K-DUR,KLOR-CON) 20 MEQ tablet Take 20 mEq by mouth daily.  Marland Kitchen senna-docusate (SENOKOT-S) 8.6-50 MG per tablet Take 1 tablet by mouth daily as needed.   . sertraline (ZOLOFT) 50 MG tablet Take 50 mg by mouth daily.  . simvastatin (ZOCOR) 10 MG tablet Take 10 mg by mouth at bedtime.   Marland Kitchen warfarin (COUMADIN) 2 MG tablet Take 2 mg by mouth one time only at 6 PM.  . [DISCONTINUED] calcium carbonate (OSCAL) 1500 (600 Ca) MG TABS tablet Take 600 mg of elemental calcium by mouth daily.  . [DISCONTINUED] ipratropium-albuterol (DUONEB) 0.5-2.5 (3) MG/3ML SOLN Take 3 mLs every 8 (eight) hours as needed by nebulization (times 72 hours).  . [DISCONTINUED] donepezil (ARICEPT) 10 MG tablet Take 10 mg by mouth at bedtime.  . [DISCONTINUED] furosemide (LASIX) 40 MG tablet Take 40 mg by mouth daily.   No facility-administered encounter medications on file as of 06/21/2017.    ROS was provided with assistance of staff Review of Systems  Constitutional: Negative for activity change and appetite change.  HENT: Positive for hearing loss. Negative for congestion, trouble swallowing and voice change.   Eyes: Negative for visual disturbance.  Respiratory: Negative for cough, choking, chest tightness, shortness of breath and wheezing.   Cardiovascular: Positive for leg swelling. Negative for chest pain and palpitations.  Endocrine: Negative for cold intolerance.  Genitourinary: Negative for difficulty urinating, dysuria, frequency and urgency.  Musculoskeletal: Positive for gait problem.  Skin: Negative for color change, pallor, rash and wound.  Neurological: Negative for tremors, speech difficulty, weakness and headaches.       Dementia  Psychiatric/Behavioral: Positive for confusion. Negative for agitation,  behavioral problems, hallucinations and sleep disturbance. The patient is nervous/anxious.     Immunization History  Administered Date(s) Administered  . Influenza, High Dose Seasonal PF 03/05/2017  . Pneumococcal Conjugate-13 04/08/2000  . Pneumococcal Polysaccharide-23 12/07/2013  . Td 11/11/2012  . Tdap 08/23/2001  . Zoster 06/05/2011   Pertinent  Health Maintenance Due  Topic Date Due  . INFLUENZA VACCINE  Completed  . PNA vac Low Risk Adult  Completed   Fall Risk  06/10/2017  Falls in the past year? Yes  Number falls in past yr: 2 or more  Injury with Fall? No   Functional Status Survey:    Vitals:   06/21/17 0952  BP: 124/70  Pulse: 66  Resp: 17  Temp: 97.8 F (36.6 C)  SpO2: 97%  Weight: 140 lb 4.8 oz (63.6 kg)  Height: 5' 9"  (1.753 m)   Body mass  index is 20.72 kg/m. Physical Exam  Constitutional: He appears well-developed and well-nourished. No distress.  HENT:  Head: Normocephalic and atraumatic.  Eyes: Conjunctivae and EOM are normal. Pupils are equal, round, and reactive to light. No scleral icterus.  Neck: Normal range of motion. Neck supple. No JVD present. No thyromegaly present.  Cardiovascular: Normal rate and regular rhythm.  Murmur heard. Pulmonary/Chest: Effort normal and breath sounds normal. He has no wheezes. He has no rales.  Abdominal: Soft. Bowel sounds are normal. He exhibits no distension. There is no tenderness.  Musculoskeletal: Normal range of motion. He exhibits edema. He exhibits no tenderness.  Transfer self, ambulates with walker. Trace edema in ankles.   Neurological: He is alert. He exhibits normal muscle tone. Coordination normal.  Oriented to person and place  Skin: Skin is warm and dry. No rash noted. He is not diaphoretic. No erythema. No pallor.  Psychiatric: He has a normal mood and affect. His behavior is normal.    Labs reviewed: Recent Labs    03/30/17 04/21/17 1108 05/04/17 05/19/17  NA 138 138 134* 137  K 4.4  3.7 3.9 4.2  CL  --  102  --   --   CO2  --  29  --   --   GLUCOSE  --  110*  --   --   BUN 19 28* 23* 29*  CREATININE 0.7 0.86 0.8  --   CALCIUM  --  8.9  --  9.0   Recent Labs    03/09/17 03/18/17 05/19/17  AST 21 16 24   ALT 29 20 25   ALKPHOS 79 62 73  BILITOT  --   --  1.1  PROT  --   --  5.5  ALBUMIN  --   --  3.5   Recent Labs    03/23/17 04/21/17 1108 05/19/17 06/24/17  WBC  --  13.2* 24 8.1  NEUTROABS  --  10.0*  --   --   HGB 12.9* 12.9* 12.4 10.8*  HCT 38* 40.2 36.6 31*  MCV  --  92.4  --   --   PLT 229 191  --  164   No results found for: TSH No results found for: HGBA1C Lab Results  Component Value Date   CHOL 141 05/18/2017   HDL 57 05/18/2017   LDLCALC 74 05/18/2017   TRIG 36 (A) 05/18/2017    Significant Diagnostic Results in last 30 days:  No results found.  Assessment/Plan Situational anxiety  Ativan 0.21m q6h prn to be re evaluated for continue needs. In the past 2 weeks, the patient needs 1-2x/day, 06/19/17 reported periods of anxiety/confusion, 06/17/17 reported the patient has concerned when spouse will return. 06/16/17 reported the patient has c/o feeling of impending doom, death, dying, 101-14-19c/o feeling of impending doom, anxiety, prn Lorazepam is effective. Sertraline was increased to 576m12/19/18. Overall, too soon to eval the patient's responses to increased Sertraline 5073md, change Lorazepam 0.5mg71mh prn to 0.5mg 30mh prn x 14 days, then re evaluate.   Dementia with behavioral disturbance Last MMSE 20/30 05/24/17, continue Donepezil 10mg 45mhe has periods of confusion.      Family/ staff Communication: plan of care reviewed with the patient and charge nurse  Labs/tests ordered:  none  Time spend 25 minutes.

## 2017-06-21 NOTE — Assessment & Plan Note (Signed)
Last MMSE 20/30 05/24/17, continue Donepezil 10mg  qd, he has periods of confusion.

## 2017-06-21 NOTE — Assessment & Plan Note (Signed)
Ativan 0.5mg  q6h prn to be re evaluated for continue needs. In the past 2 weeks, the patient needs 1-2x/day, 06/19/17 reported periods of anxiety/confusion, 06/17/17 reported the patient has concerned when spouse will return. 06/16/17 reported the patient has c/o feeling of impending doom, death, dying, 07/05/2017 c/o feeling of impending doom, anxiety, prn Lorazepam is effective. Sertraline was increased to 50mg  06/09/17. Overall, too soon to eval the patient's responses to increased Sertraline 50mg  qd, change Lorazepam 0.5mg  q6h prn to 0.5mg  q12h prn x 14 days, then re evaluate.

## 2017-06-23 ENCOUNTER — Encounter: Payer: Self-pay | Admitting: Nurse Practitioner

## 2017-06-23 ENCOUNTER — Non-Acute Institutional Stay (SKILLED_NURSING_FACILITY): Payer: Medicare Other | Admitting: Nurse Practitioner

## 2017-06-23 DIAGNOSIS — R0902 Hypoxemia: Secondary | ICD-10-CM

## 2017-06-23 DIAGNOSIS — Z7901 Long term (current) use of anticoagulants: Secondary | ICD-10-CM | POA: Diagnosis not present

## 2017-06-23 DIAGNOSIS — D72829 Elevated white blood cell count, unspecified: Secondary | ICD-10-CM | POA: Diagnosis not present

## 2017-06-23 DIAGNOSIS — Z5181 Encounter for therapeutic drug level monitoring: Secondary | ICD-10-CM

## 2017-06-23 DIAGNOSIS — I5020 Unspecified systolic (congestive) heart failure: Secondary | ICD-10-CM | POA: Diagnosis not present

## 2017-06-23 DIAGNOSIS — I481 Persistent atrial fibrillation: Secondary | ICD-10-CM

## 2017-06-23 DIAGNOSIS — F418 Other specified anxiety disorders: Secondary | ICD-10-CM | POA: Diagnosis not present

## 2017-06-23 DIAGNOSIS — I4819 Other persistent atrial fibrillation: Secondary | ICD-10-CM

## 2017-06-23 LAB — POCT INR: INR: 1.3 — AB (ref <=1.1)

## 2017-06-23 LAB — BASIC METABOLIC PANEL
BUN: 23 — AB (ref 4–21)
Creatinine: 0.8 (ref <=1.3)
GLUCOSE: 181
Potassium: 4.7 (ref 3.4–5.3)
SODIUM: 133 — AB (ref 137–147)

## 2017-06-23 LAB — PROTIME-INR: PROTIME: 14.1 — AB (ref 10.0–13.8)

## 2017-06-23 NOTE — Assessment & Plan Note (Signed)
wbc 17.0. Hgb 12.3, plt 216. CXR 06/22/17 showed CHF and pulmonary edema w/o focal consolidation, increase Furosemide to 40mg  po bid, observe the patient, f/u CBC/diff

## 2017-06-23 NOTE — Assessment & Plan Note (Signed)
O2 desaturation, the patient was found unresponsive to loud verbal stimuli 11 pm 06/22/17, not following simple commands, gray in color, cool to touch, audible rhonchi, Sat O2 78%. After apply O2 via [place] 2-4LPM, his O2 sat >90%, he is alert in his usual state of health today upon my visit. CXR 06/22/16 showed CHF and pulmonary edema, without focal consolidation.  On call order received to increase Furosemide to 40mg  at 2 pm x 3 days, continued Furosemide 40mg  qam. 06/23/16 Na 133, K 4.7, Bun 23, creat 0.83, BNP 560, wbc 17.0. Hgb 12.3, plt 216. Spoke with the patient's POA, wife, reviewed MOST: no hospitalization and comfort measures for now, delay Palliative care consultation if Morphine prn for respiratory distress/SOB and Lorazepam 0.5mg  q6h prn for anxiety can be provided.

## 2017-06-23 NOTE — Assessment & Plan Note (Signed)
O2 desaturation, the patient was found unresponsive to loud verbal stimuli 11 pm 06/22/17, not following simple commands, gray in color, cool to touch, audible rhonchi, Sat O2 78%. After apply O2 via [place] 2-4LPM, his O2 sat >90%, he is alert in his usual state of health today upon my visit. CXR 06/22/16 showed CHF and pulmonary edema, without focal consolidation.  On call order received to increase Furosemide to 40mg  at 2 pm x 3 days, continued Furosemide 40mg  qam. 06/23/16 Na 133, K 4.7, Bun 23, creat 0.83, BNP 560, wbc 17.0. Hgb 12.3, plt 216. Will increase Furosemide 40mg  po bid, repeat BMP one week.

## 2017-06-23 NOTE — Progress Notes (Signed)
Location:  [facility] Room Number: 47 Place of Service:  SNF (31) Provider:  Fransheska Willingham, Manxie NP  Blanchie Serve, MD  Patient Care Team: Blanchie Serve, MD as PCP - General (Internal Medicine) Jatoya Armbrister X, NP as Nurse Practitioner (Internal Medicine)  Extended Emergency Contact Information Primary Emergency Contact: [facility] Address: [address] [place]          [place], [place] [address] [place] Phone: [phone] Relation: Spouse Secondary Emergency Contact: Gibson,Sarah Address: [address]          [place], [place] [address] [place]net[place] Litter of [place] Phone: [phone] Relation: Other  Code Status:  DNR Goals of care: Advanced Directive information Advanced Directives 06/23/2017  Does Patient Have a Medical Advance Directive? Yes  Type of Advance Directive Out of facility DNR (pink MOST or yellow form)  Does patient want to make changes to medical advance directive? No - Patient declined  Copy of [place] in Chart? -  Pre-existing out of facility DNR order (yellow form or pink MOST form) Yellow form placed in chart (order not valid for inpatient use);Pink MOST form placed in chart (order not valid for inpatient use)     Chief Complaint  Patient presents with  . Acute Visit    SOB, Wheezing, Hypoxia, Lethargy    HPI:  Pt is a 84 y.o. male seen today for an acute visit for O2 desaturation, the patient was found unresponsive to loud verbal stimuli 11 pm 06/22/17, not following simple commands, gray in color, cool to touch, audible rhonchi, Sat O2 78%. After apply O2 via [place] 2-4LPM, his O2 sat >90%, he is alert in his usual state of health today upon my visit. CXR 06/22/16 showed CHF and pulmonary edema, without focal consolidation.  On call order received to increase Furosemide to '40mg'$  at 2 pm x 3 days, continued Furosemide '40mg'$  qam. 06/23/16 Na 133, K 4.7, Bun 23, creat 0.83, BNP 560, wbc 17.0. Hgb 12.3, plt  216. Spoke with the patient's POA, wife, reviewed MOST: no hospitalization and comfort measures for now, delay Palliative care consultation if Morphine prn for respiratory distress/SOB and Lorazepam 0.'5mg'$  q6h prn for anxiety can be provided.    Sub therapeutic INR 1.3 today, on Coumadin '2mg'$  qod for thromboembolic risk reduction, history of Afib, heart rate is in control, on Metoprolol 12.'5mg'$  bid.    Past Medical History:  Diagnosis Date  . A-fib ([place])   . Anemia   . Arthritis   . Bandemia   . Benign neoplasm of colon   . Blood glucose elevated   . Chronic thromboembolism of deep veins of proximal leg ([place])   . Corns and callosities   . Dysphagia   . Hyperlipemia   . Hypertension   . Hyposmolality syndrome   . Long term (current) use of anticoagulants   . Malignant neoplasm of prostate ([place])   . Memory loss   . Multiple myeloma in remission ([place])   . Pacemaker   . Post-traumatic osteoarthritis of left hip    Past Surgical History:  Procedure Laterality Date  . EYE SURGERY     cataract surgery  . FRACTURE SURGERY     left hemiarthroplasty 10/15/13 Dr. Alfredia Client  . HEMORRHOID SURGERY    . JOINT REPLACEMENT     right knee replacement by Dr. Onnie Graham  . PACEMAKER PLACEMENT    . PROSTATE SURGERY      Allergies  Allergen  Reactions  . Codeine   . Hctz [Hydrochlorothiazide]   . Lipitor [Atorvastatin]   . Quinine Derivatives   . Sulfa Antibiotics     Outpatient Encounter Medications as of 06/23/2017  Medication Sig  . acetaminophen (TYLENOL) 325 MG tablet Take 325 mg by mouth at bedtime as needed.   Marland Kitchen acetaminophen (TYLENOL) 325 MG tablet Take 650 mg by mouth every 6 (six) hours as needed.  Marland Kitchen acyclovir (ZOVIRAX) 400 MG tablet Take 400 mg by mouth daily.  Marland Kitchen amoxicillin (AMOXIL) 500 MG tablet Take 500 mg by mouth as needed. For use with dental appointments.  Marland Kitchen dexamethasone (DECADRON) 2 MG tablet Take 10 tablets (20 mg total) by mouth once a week.  . ferrous sulfate 325 (65  FE) MG EC tablet Take 325 mg by mouth daily with breakfast.  . furosemide (LASIX) 20 MG tablet Take 20 mg by mouth daily.  . furosemide (LASIX) 40 MG tablet Take 40 mg by mouth.  Marland Kitchen LORazepam (ATIVAN) 0.5 MG tablet Take 0.5 mg by mouth every 12 (twelve) hours as needed for anxiety.  . Menthol, Topical Analgesic, (BIOFREEZE) 4 % GEL Apply 1 application topically daily as needed.  . metoprolol tartrate (LOPRESSOR) 25 MG tablet Take 12.5 mg by mouth daily.   . MULTIPLE VITAMIN PO Take 1 tablet by mouth daily.  . nitroGLYCERIN (NITROSTAT) 0.4 MG SL tablet Place 0.4 mg every 5 (five) minutes as needed under the tongue for chest pain.  Marland Kitchen omeprazole (PRILOSEC) 20 MG capsule Take 20 mg by mouth daily.  . Polyvinyl Alcohol-Povidone (REFRESH OP) Apply 1 drop to eye as needed.  . potassium chloride SA (K-DUR,KLOR-CON) 20 MEQ tablet Take 20 mEq by mouth daily.  Marland Kitchen senna-docusate (SENOKOT-S) 8.6-50 MG per tablet Take 1 tablet by mouth daily as needed.   . sertraline (ZOLOFT) 50 MG tablet Take 50 mg by mouth daily.  . simvastatin (ZOCOR) 10 MG tablet Take 10 mg by mouth at bedtime.   Marland Kitchen warfarin (COUMADIN) 2 MG tablet Take 2 mg by mouth one time only at 6 PM.  . [DISCONTINUED] calcium carbonate (OSCAL) 1500 (600 Ca) MG TABS tablet Take 600 mg of elemental calcium by mouth daily.  . [DISCONTINUED] ipratropium-albuterol (DUONEB) 0.5-2.5 (3) MG/3ML SOLN Take 3 mLs every 8 (eight) hours as needed by nebulization (times 72 hours).   No facility-administered encounter medications on file as of 06/23/2017.    ROS was provided with assistance of the patient's POA wife and staff Review of Systems  Constitutional: Positive for activity change, appetite change and fatigue. Negative for chills, diaphoresis and fever.  HENT: Positive for hearing loss. Negative for congestion, trouble swallowing and voice change.   Eyes: Negative for visual disturbance.  Respiratory: Positive for chest tightness and shortness of breath.  Negative for cough, choking and wheezing.   Cardiovascular: Positive for leg swelling. Negative for chest pain and palpitations.  Gastrointestinal: Negative for abdominal distention and abdominal pain.  Endocrine: Negative for cold intolerance.  Genitourinary: Negative for difficulty urinating, dysuria and urgency.  Musculoskeletal: Positive for gait problem. Negative for joint swelling.  Skin: Negative for color change and pallor.  Neurological: Negative for dizziness, tremors, seizures, speech difficulty, weakness, numbness and headaches.  Psychiatric/Behavioral: Positive for confusion. Negative for agitation, behavioral problems, hallucinations and sleep disturbance. The patient is nervous/anxious.     Immunization History  Administered Date(s) Administered  . Influenza, High Dose Seasonal PF 03/05/2017  . Pneumococcal Conjugate-13 04/08/2000  . Pneumococcal Polysaccharide-23 12/07/2013  . Td 11/11/2012  .  Tdap 08/23/2001  . Zoster 06/05/2011   Pertinent  Health Maintenance Due  Topic Date Due  . INFLUENZA VACCINE  Completed  . PNA vac Low Risk Adult  Completed   Fall Risk  06/10/2017  Falls in the past year? Yes  Number falls in past yr: 2 or more  Injury with Fall? No   Functional Status Survey:    Vitals:   06/23/17 1320  BP: 114/60  Pulse: 64  Resp: 20  Temp: 98.4 F (36.9 C)  SpO2: 97%  Weight: 140 lb (63.5 kg)  Height: _0  (1.753 m)   Body mass index is 20.67 kg/m. Physical Exam  Constitutional: He appears well-developed and well-nourished. No distress.  HENT:  Head: Normocephalic and atraumatic.  Eyes: Conjunctivae and EOM are normal. Pupils are equal, round, and reactive to light. No scleral icterus.  Neck: Normal range of motion. Neck supple. No JVD present. No thyromegaly present.  Cardiovascular: Normal rate and regular rhythm.  Murmur heard. Irregular heart beats.   Pulmonary/Chest: Effort normal and breath sounds normal. He has no wheezes. He  has no rales.  Abdominal: Soft. Bowel sounds are normal. He exhibits no distension. There is no tenderness.  Musculoskeletal: Normal range of motion. He exhibits edema. He exhibits no tenderness.  Trace edema in ankles.  Neurological: He is alert. He exhibits normal muscle tone. Coordination normal.  Oriented to person and place.   Skin: Skin is warm and dry. No rash noted. He is not diaphoretic. No erythema. No pallor.  Psychiatric: He has a normal mood and affect. His behavior is normal.  anxious    Labs reviewed: Recent Labs    03/30/17 04/21/17 1108 05/04/17 05/19/17  NA 138 138 134* 137  K 4.4 3.7 3.9 4.2  CL  --  102  --   --   CO2  --  29  --   --   GLUCOSE  --  110*  --   --   BUN 19 28* 23* 29*  CREATININE 0.7 0.86 0.8  --   CALCIUM  --  8.9  --  9.0   Recent Labs    03/09/17 03/18/17 05/19/17  AST _1 ALT _2 ALKPHOS 79 62 73  BILITOT  --   --  1.1  PROT  --   --  5.5  ALBUMIN  --   --  3.5   Recent Labs    03/18/17 03/23/17 04/21/17 1108 05/19/17  WBC 11.4  --  13.2* 24  NEUTROABS  --   --  10.0*  --   HGB 13.0* 12.9* 12.9* 12.4  HCT 39* 38* 40.2 36.6  MCV  --   --  92.4  --   PLT 172 229 191  --    No results found for: TSH No results found for: HGBA1C Lab Results  Component Value Date   CHOL 141 05/18/2017   HDL 57 05/18/2017   LDLCALC 74 05/18/2017   TRIG 36 (A) 05/18/2017    Significant Diagnostic Results in last 30 days:  No results found.  Assessment/Plan Hypoxia O2 desaturation, the patient was found unresponsive to loud verbal stimuli 11 pm 06/22/17, not following simple commands, gray in color, cool to touch, audible rhonchi, Sat O2 78%. After apply O2 via [place] 2-4LPM, his O2 sat >90%, he is alert in his usual state of health today upon my visit. CXR 06/22/16 showed CHF and pulmonary edema, without focal consolidation.  On call  order received to increase Furosemide to 32m at 2 pm x 3 days, continued Furosemide 470mqam. 06/23/16 Na  133, K 4.7, Bun 23, creat 0.83, BNP 560, wbc 17.0. Hgb 12.3, plt 216. Spoke with the patient's POA, wife, reviewed MOST: no hospitalization and comfort measures for now, delay Palliative care consultation if Morphine prn for respiratory distress/SOB and Lorazepam 0.71m20m6h prn for anxiety can be provided.      CHF (congestive heart failure) (HCC) O2 desaturation, the patient was found unresponsive to loud verbal stimuli 11 pm 06/22/17, not following simple commands, gray in color, cool to touch, audible rhonchi, Sat O2 78%. After apply O2 via [place] 2-4LPM, his O2 sat >90%, he is alert in his usual state of health today upon my visit. CXR 06/22/16 showed CHF and pulmonary edema, without focal consolidation.  On call order received to increase Furosemide to 31m71m 2 pm x 3 days, continued Furosemide 31mg47m. 06/23/16 Na 133, K 4.7, Bun 23, creat 0.83, BNP 560, wbc 17.0. Hgb 12.3, plt 216. Will increase Furosemide 31mg 73mid, repeat BMP one week.      Leukocytosis wbc 17.0. Hgb 12.3, plt 216. CXR 06/22/17 showed CHF and pulmonary edema w/o focal consolidation, increase Furosemide to 31mg p93md, observe the patient, f/u CBC/diff      Subtherapeutic anticoagulation Chronic A-Fib. S/p DVT x2, Sub therapeutic INR 1.3 today, on Coumadin 2mg qod59mr thromboembolic risk reduction, change Coumadin 4mg x1 150m19, then 2mg daily31mT/INR in one week.   A-fib history of Afib, heart rate is in control, on Metoprolol 12.71mg bid.  24mSituational anxiety Persisted anxious mood, will continue Sertraline, change Lorazepam 0.71mg q6h prn49mbserve the patient. His plan of care to comfort measures.      Family/ staff Communication: plan of care reviewed with the patient, the patient's POA wife, and charge nurse.   Labs/tests ordered:  CBC/diff 06/24/17, BMP, PT/INR one week  Time spend 25 minutes.

## 2017-06-23 NOTE — Assessment & Plan Note (Signed)
history of Afib, heart rate is in control, on Metoprolol 12.5mg  bid.

## 2017-06-23 NOTE — Assessment & Plan Note (Signed)
Chronic A-Fib. S/p DVT x2, Sub therapeutic INR 1.3 today, on Coumadin 2mg  qod for thromboembolic risk reduction, change Coumadin 4mg  x1 06/23/17, then 2mg  daily, PT/INR in one week.

## 2017-06-23 NOTE — Assessment & Plan Note (Signed)
Persisted anxious mood, will continue Sertraline, change Lorazepam 0.5mg  q6h prn. Observe the patient. His plan of care to comfort measures.

## 2017-06-24 LAB — CBC AND DIFFERENTIAL
HCT: 31 — AB (ref 41–53)
Hemoglobin: 10.8 — AB (ref 13.5–17.5)
Platelets: 164 (ref 150–399)
WBC: 8.1

## 2017-06-25 ENCOUNTER — Other Ambulatory Visit: Payer: Self-pay | Admitting: *Deleted

## 2017-06-29 ENCOUNTER — Other Ambulatory Visit: Payer: Self-pay | Admitting: Nurse Practitioner

## 2017-06-29 LAB — BASIC METABOLIC PANEL
BUN: 16 (ref 4–21)
CREATININE: 0.7 (ref <=1.3)
Glucose: 144
Potassium: 3.6 (ref 3.4–5.3)
SODIUM: 135 — AB (ref 137–147)

## 2017-06-29 LAB — POCT INR: INR: 2.1 — AB (ref <=1.1)

## 2017-06-29 LAB — PROTIME-INR: Protime: 22.3 — AB (ref 10.0–13.8)

## 2017-07-01 ENCOUNTER — Other Ambulatory Visit: Payer: Self-pay | Admitting: *Deleted

## 2017-07-06 ENCOUNTER — Encounter: Payer: Self-pay | Admitting: Nurse Practitioner

## 2017-07-06 ENCOUNTER — Non-Acute Institutional Stay (SKILLED_NURSING_FACILITY): Payer: Medicare Other | Admitting: Nurse Practitioner

## 2017-07-06 DIAGNOSIS — I200 Unstable angina: Secondary | ICD-10-CM

## 2017-07-06 DIAGNOSIS — Z7901 Long term (current) use of anticoagulants: Secondary | ICD-10-CM

## 2017-07-06 DIAGNOSIS — D4989 Neoplasm of unspecified behavior of other specified sites: Secondary | ICD-10-CM

## 2017-07-06 DIAGNOSIS — S0181XA Laceration without foreign body of other part of head, initial encounter: Secondary | ICD-10-CM | POA: Insufficient documentation

## 2017-07-06 DIAGNOSIS — F418 Other specified anxiety disorders: Secondary | ICD-10-CM | POA: Diagnosis not present

## 2017-07-06 DIAGNOSIS — S0181XD Laceration without foreign body of other part of head, subsequent encounter: Secondary | ICD-10-CM

## 2017-07-06 DIAGNOSIS — I5040 Unspecified combined systolic (congestive) and diastolic (congestive) heart failure: Secondary | ICD-10-CM

## 2017-07-06 DIAGNOSIS — I4819 Other persistent atrial fibrillation: Secondary | ICD-10-CM

## 2017-07-06 DIAGNOSIS — I481 Persistent atrial fibrillation: Secondary | ICD-10-CM | POA: Diagnosis not present

## 2017-07-06 NOTE — Assessment & Plan Note (Signed)
anxiety, SOB, diaphoretic, c/o mid stern pain, stated I am dying, 5-6 episodes in the past week, wife is at bedside, his goal of care is comfort measure. He had Morphine 5mg , Lorazepam 0.5mg , NTG about an hour ago, ineffective. Lorazepam 1mg  stat x1, will have Lorazepam 0.5mg  q4h prn available x 2 weeks, increase Sertraline 75mg  po daily, observe.

## 2017-07-06 NOTE — Assessment & Plan Note (Signed)
CHF on Furosemide 40mg  po bid, trace edema in BLE, weights showed #144Ibs 07/05/17, #147Ibs 07/06/17, in setting of SOB and chest pain, will administer Furosemide 40mg  x 1, continue to monitor weight and s/s of CHF.

## 2017-07-06 NOTE — Assessment & Plan Note (Addendum)
[place] 07/04/17, ustained left forehead laceration, suture closure at [facility] is in place, right forearm skin tear is well approximated, no sign of infection. Sutures should be removed 7-10 days. Observe.

## 2017-07-06 NOTE — Assessment & Plan Note (Signed)
Chronic A-Fib. S/p DVT x2 07/06/17 INR 2.3, PT/INR one week.

## 2017-07-06 NOTE — Assessment & Plan Note (Signed)
He had oncology f/u early today, wife said he will return for f/u in 6 months and discontinued Dexamethasone.

## 2017-07-06 NOTE — Assessment & Plan Note (Signed)
Heart rate is in control, continue Metoprolol and Coumadin

## 2017-07-06 NOTE — Assessment & Plan Note (Signed)
Prn NTG per Cardiology 04/22/17, Morphine prn available to him for comfort measures.

## 2017-07-06 NOTE — Progress Notes (Signed)
Location:  [facility] Room Number: 22 Place of Service:  SNF (31) Provider:  Natasha Paulson, Manxie  NP  Blanchie Serve, MD  Patient Care Team: Blanchie Serve, MD as PCP - General (Internal Medicine) Airica Schwartzkopf X, NP as Nurse Practitioner (Internal Medicine)  Extended Emergency Contact Information Primary Emergency Contact: [facility] Address: [address] [place]          [place], [place] [address] [place] Phone: [phone] Relation: Spouse Secondary Emergency Contact: Gibson,Sarah Address: [address]          [place],  [address] [place] of [place] Phone: [phone] Relation: Other  Code Status:DNR Goals of care: Advanced Directive information Advanced Directives 06/23/2017  Does Patient Have a Medical Advance Directive? Yes  Type of Advance Directive Out of facility DNR (pink MOST or yellow form)  Does patient want to make changes to medical advance directive? No - Patient declined  Copy of [place] in Chart? -  Pre-existing out of facility DNR order (yellow form or pink MOST form) Yellow form placed in chart (order not valid for inpatient use);Pink MOST form placed in chart (order not valid for inpatient use)     Chief Complaint  Patient presents with  . Acute Visit    SOB, severe anxiety    HPI:  Pt is a 84 y.o. male seen today for an acute visit for anxiety, SOB, diaphoretic, c/o mid stern pain, stated I am dying, 5-6 episodes in the past week, wife is at bedside, his goal of care is comfort measure. He had Morphine 16m, Lorazepam 0.5104m NTG about an hour ago, ineffective. [place]/13/19, ustained left forehead laceration, suture closure [facility] is in place, right forearm skin tear is well approximated, no sign of infection. He had oncology f/u early today, wife said he will return for f/u in 6 months and discontinued Dexamethasone.   CHF on Furosemide 4053mo bid, trace edema  in BLE, weights showed #144Ibs 07/05/17, #147Ibs 07/06/17. Hx of Afib, heart rate is in control, on Metoprolol. Therapeutic INR today, on Coumadin for thromboembolic risk reduction.    Past Medical History:  Diagnosis Date  . A-fib ([place] . Anemia   . Arthritis   . Bandemia   . Benign neoplasm of colon   . Blood glucose elevated   . Chronic thromboembolism of deep veins of proximal leg ([place]CCIda . Corns and callosities   . Dysphagia   . Hyperlipemia   . Hypertension   . Hyposmolality syndrome   . Long term (current) use of anticoagulants   . Malignant neoplasm of prostate ([place] . Memory loss   . Multiple myeloma in remission ([facility]CC[facility]ia . Pacemaker   . Post-traumatic osteoarthritis of left hip    Past Surgical History:  Procedure Laterality Date  . EYE SURGERY     cataract surgery  . FRACTURE SURGERY     left hemiarthroplasty 10/15/13 Dr. KenAlfredia Client HEMORRHOID SURGERY    . JOINT REPLACEMENT     right knee replacement by Dr. WelOnnie Graham PACEMAKER PLACEMENT    . PROSTATE SURGERY      Allergies  Allergen Reactions  . Codeine   . Hctz [Hydrochlorothiazide]   . Lipitor [Atorvastatin]   . Quinine Derivatives   . Sulfa Antibiotics     Outpatient Encounter Medications as of 07/06/2017  Medication Sig  . acetaminophen (TYLENOL) 325  MG tablet Take 325 mg by mouth at bedtime as needed.   Marland Kitchen acetaminophen (TYLENOL) 325 MG tablet Take 650 mg by mouth every 6 (six) hours as needed.  Marland Kitchen acyclovir (ZOVIRAX) 400 MG tablet Take 400 mg by mouth daily.  Marland Kitchen amoxicillin (AMOXIL) 500 MG tablet Take 500 mg by mouth as needed. For use with dental appointments.  Marland Kitchen dexamethasone (DECADRON) 2 MG tablet Take 10 tablets (20 mg total) by mouth once a week.  . ferrous sulfate 325 (65 FE) MG EC tablet Take 325 mg by mouth daily with breakfast.  . Menthol, Topical Analgesic, (BIOFREEZE) 4 % GEL Apply 1 application topically daily as needed.  . metoprolol tartrate (LOPRESSOR) 25 MG tablet Take 12.5 mg  by mouth daily.   . MULTIPLE VITAMIN PO Take 1 tablet by mouth daily.  . nitroGLYCERIN (NITROSTAT) 0.4 MG SL tablet Place 0.4 mg every 5 (five) minutes as needed under the tongue for chest pain.  Marland Kitchen omeprazole (PRILOSEC) 20 MG capsule Take 20 mg by mouth daily.  . Polyvinyl Alcohol-Povidone (REFRESH OP) Apply 1 drop to eye as needed.  . potassium chloride SA (K-DUR,KLOR-CON) 20 MEQ tablet Take 20 mEq by mouth daily.  Marland Kitchen senna-docusate (SENOKOT-S) 8.6-50 MG per tablet Take 1 tablet by mouth daily as needed.   . sertraline (ZOLOFT) 50 MG tablet Take 50 mg by mouth daily.  . simvastatin (ZOCOR) 10 MG tablet Take 10 mg by mouth at bedtime.   Marland Kitchen warfarin (COUMADIN) 2 MG tablet Take 2 mg by mouth one time only at 6 PM.  . [DISCONTINUED] furosemide (LASIX) 20 MG tablet Take 20 mg by mouth daily.  . [DISCONTINUED] furosemide (LASIX) 40 MG tablet Take 40 mg by mouth.  . [DISCONTINUED] LORazepam (ATIVAN) 0.5 MG tablet Take 0.5 mg by mouth every 12 (twelve) hours as needed for anxiety.   No facility-administered encounter medications on file as of 07/06/2017.    ROS was provided with assistant of staff Review of Systems  Constitutional: Positive for activity change, appetite change, diaphoresis and fatigue. Negative for chills and fever.  HENT: Positive for hearing loss. Negative for congestion, trouble swallowing and voice change.   Eyes: Negative for visual disturbance.  Respiratory: Positive for shortness of breath. Negative for wheezing.   Cardiovascular: Positive for chest pain and leg swelling. Negative for palpitations.  Gastrointestinal: Negative for abdominal distention, abdominal pain, constipation, diarrhea, nausea and vomiting.  Genitourinary: Negative for difficulty urinating, dysuria and urgency.  Musculoskeletal: Positive for gait problem.  Skin: Positive for color change.  Neurological: Negative for tremors, speech difficulty, weakness and headaches.  Psychiatric/Behavioral: Positive for  agitation, behavioral problems, confusion and sleep disturbance. Negative for hallucinations. The patient is nervous/anxious.     Immunization History  Administered Date(s) Administered  . Influenza, High Dose Seasonal PF 03/05/2017  . Pneumococcal Conjugate-13 04/08/2000  . Pneumococcal Polysaccharide-23 12/07/2013  . Td 11/11/2012  . Tdap 08/23/2001  . Zoster 06/05/2011   Pertinent  Health Maintenance Due  Topic Date Due  . INFLUENZA VACCINE  Completed  . PNA vac Low Risk Adult  Completed   Fall Risk  06/10/2017  Falls in the past year? Yes  Number falls in past yr: 2 or more  Injury with Fall? No   Functional Status Survey:    Vitals:   07/06/17 1157  BP: 130/70  Pulse: 78  Resp: 20  Temp: 98.3 F (36.8 C)  SpO2: 95%  Weight: 143 lb 11.2 oz (65.2 kg)  Height: 5' 9"  (1.753  m)   Body mass index is 21.22 kg/m. Physical Exam  Constitutional: He appears well-developed and well-nourished. He appears distressed.  HENT:  Head: Normocephalic and atraumatic.  Eyes: Conjunctivae and EOM are normal. Pupils are equal, round, and reactive to light.  Neck: Normal range of motion. Neck supple. No JVD present. No thyromegaly present.  Cardiovascular: Normal rate and regular rhythm.  Murmur heard. Pacemaker.   Pulmonary/Chest: He is in respiratory distress. He has rales.  Abdominal: Soft. Bowel sounds are normal. He exhibits no distension. There is no tenderness.  Musculoskeletal: He exhibits edema. He exhibits no tenderness.  Trace edema BLE  Neurological: He is alert. He exhibits normal muscle tone. Coordination normal.  Oriented to person and place.   Skin: He is diaphoretic.  Left forehead suture closure for laceration, right forearm skin tear sustained from fall 07/04/17  Psychiatric:  Anxious.     Labs reviewed: Recent Labs    04/21/17 1108 05/04/17 05/19/17 06/23/17 06/29/17  NA 138 134* 137 133* 135*  K 3.7 3.9 4.2 4.7 3.6  CL 102  --   --   --   --   CO2 29   --   --   --   --   GLUCOSE 110*  --   --   --   --   BUN 28* 23* 29* 23* 16  CREATININE 0.86 0.8  --  0.8 0.7  CALCIUM 8.9  --  9.0  --   --    Recent Labs    03/09/17 03/18/17 05/19/17  AST 21 16 24   ALT 29 20 25   ALKPHOS 79 62 73  BILITOT  --   --  1.1  PROT  --   --  5.5  ALBUMIN  --   --  3.5   Recent Labs    03/23/17 04/21/17 1108 05/19/17 06/24/17  WBC  --  13.2* 24 8.1  NEUTROABS  --  10.0*  --   --   HGB 12.9* 12.9* 12.4 10.8*  HCT 38* 40.2 36.6 31*  MCV  --  92.4  --   --   PLT 229 191  --  164   No results found for: TSH No results found for: HGBA1C Lab Results  Component Value Date   CHOL 141 05/18/2017   HDL 57 05/18/2017   LDLCALC 74 05/18/2017   TRIG 36 (A) 05/18/2017    Significant Diagnostic Results in last 30 days:  No results found.  Assessment/Plan Situational anxiety anxiety, SOB, diaphoretic, c/o mid stern pain, stated I am dying, 5-6 episodes in the past week, wife is at bedside, his goal of care is comfort measure. He had Morphine 68m, Lorazepam 0.585m NTG about an hour ago, ineffective. Lorazepam 38m34mtat x1, will have Lorazepam 0.5mg84mh prn available x 2 weeks, increase Sertraline 75mg48mdaily, observe.   Chest pain Prn NTG per Cardiology 04/22/17, Morphine prn available to him for comfort measures.    Forehead laceration [place]/19, ustained left forehead laceration, suture [facility] is [facility] place, right forearm skin tear is well approximated, no sign of infection. Sutures should be removed 7-10 days. Observe.   Plasma cell neoplasm He had oncology f/u early today, wife said he will return for f/u in 6 months and discontinued Dexamethasone.    CHF (congestive heart failure) (HCC) CHF on Furosemide 40mg 88mid, trace edema in BLE, weights showed #144Ibs 07/05/17, #147Ibs 07/06/17, in setting of SOB and chest pain, will  administer Furosemide 53m x 1, continue to monitor weight and s/s of CHF.   A-fib Heart rate is  in control, continue Metoprolol and Coumadin  Long term current use of anticoagulants with INR goal of 2.0-3.0 Chronic A-Fib. S/p DVT x2 07/06/17 INR 2.3, PT/INR one week.      Family/ staff Communication: plan of care reviewed with the patient, patient's wife, and charge nurse  Labs/tests ordered:  None  Time spend 25 minutes.

## 2017-07-19 ENCOUNTER — Encounter: Payer: Self-pay | Admitting: Nurse Practitioner

## 2017-07-19 ENCOUNTER — Non-Acute Institutional Stay (SKILLED_NURSING_FACILITY): Payer: Medicare Other | Admitting: Nurse Practitioner

## 2017-07-19 DIAGNOSIS — K219 Gastro-esophageal reflux disease without esophagitis: Secondary | ICD-10-CM

## 2017-07-19 DIAGNOSIS — I5040 Unspecified combined systolic (congestive) and diastolic (congestive) heart failure: Secondary | ICD-10-CM | POA: Diagnosis not present

## 2017-07-19 DIAGNOSIS — F418 Other specified anxiety disorders: Secondary | ICD-10-CM | POA: Diagnosis not present

## 2017-07-19 NOTE — Assessment & Plan Note (Signed)
GERD, stable, continue Omeprazole 20mg  daily, less c/o "chest/epigastric" discomfort.

## 2017-07-19 NOTE — Assessment & Plan Note (Signed)
CHF, clinically compensated, continue Furosemide 40mg  bid, observe

## 2017-07-19 NOTE — Progress Notes (Signed)
Location:  [place] Room Number: 28 Place of Service:  SNF (31) Provider:  Maelani Yarbro, Manxie  NP  Blanchie Serve, MD  Patient Care Team: Blanchie Serve, MD as PCP - General (Internal Medicine) Judeen Geralds X, NP as Nurse Practitioner (Internal Medicine)  Extended Emergency Contact Information Primary Emergency Contact: [facility] Address: [address] [place]          [place], [place] [address] [place] Phone: [phone] Relation: Spouse Secondary Emergency Contact: Gibson,Sarah Address: [address]          [place], [place] [address] [place] Phone: [phone] Relation: Other  Code Status:  DNR Goals of care: Advanced Directive information Advanced Directives 07/20/2017  Does Patient Have a Medical Advance Directive? Yes  Type of Advance Directive Out of facility DNR (pink MOST or yellow form)  Does patient want to make changes to medical advance directive? No - Patient declined  Copy of New  in Chart? -  Pre-existing out of facility DNR order (yellow form or pink MOST form) Yellow form placed in chart (order not valid for inpatient use);Pink MOST form placed in chart (order not valid for inpatient use)     Chief Complaint  Patient presents with  . Acute Visit    [place] anxiety issues     HPI:  Pt is a 84 y.o. male seen today for an acute visit for anxiety, re evaluation for continuation of prn Lorazepam 0.37m q12h use, he used most of Lorazepam 1-2x/day. He is taking Sertraline 567mdaily since 06/09/17. His mood is stabilizing. CHF, clinically compensated on Furosemide 4074mid, GERD, stable, on Omeprazole 76m79mily, less c/o "chest/epigastric" discomfort.    Past Medical History:  Diagnosis Date  . A-fib (HCC)[place]. Anemia   . Arthritis   . Bandemia   . Benign neoplasm of colon   . Blood glucose elevated   . Chronic thromboembolism of deep veins of proximal leg (HCC)[place]  . Corns and callosities   . Dysphagia   . Hyperlipemia   . Hypertension   . Hyposmolality syndrome   . Long term (current) use of anticoagulants   . Malignant neoplasm of prostate (HCC)[place]. Memory loss   . Multiple myeloma in remission (HCC)[place]. Pacemaker   . Post-traumatic osteoarthritis of left hip    Past Surgical History:  Procedure Laterality Date  . EYE SURGERY     cataract surgery  . FRACTURE SURGERY     left hemiarthroplasty 10/15/13 Dr. KennAlfredia ClientHEMORRHOID SURGERY    . JOINT REPLACEMENT     right knee replacement by Dr. WellOnnie GrahamPACEMAKER PLACEMENT    . PROSTATE SURGERY      Allergies  Allergen Reactions  . Codeine   . Hctz [Hydrochlorothiazide]   . Lipitor [Atorvastatin]   . Quinine Derivatives   . Sulfa Antibiotics     Outpatient Encounter Medications as of 07/19/2017  Medication Sig  . acetaminophen (TYLENOL) 325 MG tablet Take 325 mg by mouth at bedtime as needed.   . acMarland Kitchentaminophen (TYLENOL) 325 MG tablet Take 650 mg by mouth every 6 (six) hours as needed.  . acMarland Kitchenclovir (ZOVIRAX) 400 MG tablet Take 400 mg by mouth daily.  . amMarland Kitchenxicillin (AMOXIL) 500 MG tablet Take 500 mg by mouth as needed. For use with dental appointments.  . ferrous sulfate 325 (65 FE) MG EC tablet  Take 325 mg by mouth daily with breakfast.  . furosemide (LASIX) 40 MG tablet Take 40 mg by mouth 2 (two) times daily.  Marland Kitchen ipratropium-albuterol (DUONEB) 0.5-2.5 (3) MG/3ML SOLN Take 3 mLs by nebulization every 6 (six) hours as needed (For decreased saturations and wheezing).  . LORazepam (ATIVAN) 0.5 MG tablet Take 0.5 mg by mouth every 12 (twelve) hours as needed for anxiety.   . Menthol, Topical Analgesic, (BIOFREEZE) 4 % GEL Apply 1 application topically 2 (two) times daily as needed.   . metoprolol tartrate (LOPRESSOR) 25 MG tablet Take 12.5 mg by mouth daily.   . nitroGLYCERIN (NITROSTAT) 0.4 MG SL tablet Place 0.4 mg every 5 (five) minutes as needed under the tongue for chest pain.   Marland Kitchen omeprazole (PRILOSEC) 20 MG capsule Take 20 mg by mouth daily.  . Polyvinyl Alcohol-Povidone (REFRESH OP) Apply 1 drop to eye as needed.  . potassium chloride SA (K-DUR,KLOR-CON) 20 MEQ tablet Take 20 mEq by mouth daily.  Marland Kitchen senna-docusate (SENOKOT-S) 8.6-50 MG per tablet Take 1 tablet by mouth daily as needed.   . sertraline (ZOLOFT) 50 MG tablet Take 75 mg by mouth daily.   Marland Kitchen warfarin (COUMADIN) 2 MG tablet Take 2 mg by mouth one time only at 6 PM.  . [DISCONTINUED] NON FORMULARY Take by mouth 2 (two) times daily as needed (Morphine-Give 5 mg every 2 hours as needed for respiratory distress.).  . [DISCONTINUED] sertraline (ZOLOFT) 25 MG tablet Take 25 mg by mouth daily. Give 75 mg by mouth daily.  . [DISCONTINUED] dexamethasone (DECADRON) 2 MG tablet Take 10 tablets (20 mg total) by mouth once a week.  . [DISCONTINUED] MULTIPLE VITAMIN PO Take 1 tablet by mouth daily.  . [DISCONTINUED] simvastatin (ZOCOR) 10 MG tablet Take 10 mg by mouth at bedtime.    No facility-administered encounter medications on file as of 07/19/2017.    ROS was provided with assistance of staff Review of Systems  Constitutional: Negative for activity change, appetite change, chills, diaphoresis, fatigue and fever.  HENT: Positive for hearing loss.   Respiratory: Positive for shortness of breath. Negative for cough, choking, chest tightness and wheezing.        O2 dependent  Cardiovascular: Positive for leg swelling. Negative for chest pain and palpitations.  Skin: Negative for color change and pallor.  Neurological: Negative for tremors, speech difficulty, weakness and headaches.       Memory recall difficulties.   Psychiatric/Behavioral: Positive for confusion. Negative for agitation, behavioral problems and sleep disturbance. The patient is nervous/anxious.     Immunization History  Administered Date(s) Administered  . Influenza, High Dose Seasonal PF 03/05/2017  . Pneumococcal Conjugate-13 04/08/2000  .  Pneumococcal Polysaccharide-23 12/07/2013  . Td 11/11/2012  . Tdap 08/23/2001  . Zoster 06/05/2011   Pertinent  Health Maintenance Due  Topic Date Due  . INFLUENZA VACCINE  Completed  . PNA vac Low Risk Adult  Completed   Fall Risk  06/10/2017  Falls in the past year? Yes  Number falls in past yr: 2 or more  Injury with Fall? No   Functional Status Survey:    Vitals:   07/19/17 1154  BP: 118/70  Pulse: 64  Resp: 16  Temp: 98.1 F (36.7 C)  SpO2: 90%  Weight: 137 lb 4.8 oz (62.3 kg)  Height: _0  (1.753 m)   Body mass index is 20.28 kg/m. Physical Exam  Constitutional: He appears well-developed and well-nourished. No distress.  HENT:  Head: Normocephalic and atraumatic.  Eyes: Conjunctivae and EOM are normal. Pupils are equal, round, and reactive to light.  Neck: Normal range of motion. Neck supple. No JVD present. No thyromegaly present.  Cardiovascular: Normal rate and regular rhythm.  Murmur heard. Left upper chest pacemaker.   Pulmonary/Chest: Effort normal. He has no wheezes. He has rales.  Musculoskeletal: He exhibits edema.  Trace edema left ankle.   Neurological: He is alert. He exhibits normal muscle tone. Coordination normal.  Oriented to person and place.   Skin: Skin is warm and dry. He is not diaphoretic.  Psychiatric: He has a normal mood and affect. His behavior is normal.    Labs reviewed: Recent Labs    04/21/17 1108 05/04/17 05/19/17 06/23/17 06/29/17  NA 138 134* 137 133* 135*  K 3.7 3.9 4.2 4.7 3.6  CL 102  --   --   --   --   CO2 29  --   --   --   --   GLUCOSE 110*  --   --   --   --   BUN 28* 23* 29* 23* 16  CREATININE 0.86 0.8  --  0.8 0.7  CALCIUM 8.9  --  9.0  --   --    Recent Labs    03/09/17 03/18/17 05/19/17  AST _0 ALT _1 ALKPHOS 79 62 73  BILITOT  --   --  1.1  PROT  --   --  5.5  ALBUMIN  --   --  3.5   Recent Labs    03/23/17 04/21/17 1108 05/19/17 06/24/17  WBC  --  13.2* 24 8.1  NEUTROABS  --   10.0*  --   --   HGB 12.9* 12.9* 12.4 10.8*  HCT 38* 40.2 36.6 31*  MCV  --  92.4  --   --   PLT 229 191  --  164   No results found for: TSH No results found for: HGBA1C Lab Results  Component Value Date   CHOL 141 05/18/2017   HDL 57 05/18/2017   LDLCALC 74 05/18/2017   TRIG 36 (A) 05/18/2017    Significant Diagnostic Results in last 30 days:  No results found.  Assessment/Plan Situational anxiety anxiety, re evaluation for continuation of prn Lorazepam 0.6m q12h use, he used most of Lorazepam 1-2x/day in the past 14 days. He is taking Sertraline 552mdaily since 06/09/17. His mood is stabilizing in setting of Morphine prn for SOB, Furosemide 4067mid for CHF, Omeprazole for GERD.  Will continue Lorazepam 0.5mg42m2h prn for 14 days, then re-evaluate  CHF (congestive heart failure) (HCC) CHF, clinically compensated, continue Furosemide 40mg47m, observe  GERD (gastroesophageal reflux disease)  GERD, stable, continue Omeprazole 20mg 80my, less c/o "chest/epigastric" discomfort.       Family/ staff Communication: plan of care reviewed with the patient and charge nurse  Labs/tests ordered:  none  Time spend 25 minutes

## 2017-07-19 NOTE — Assessment & Plan Note (Addendum)
anxiety, re evaluation for continuation of prn Lorazepam 0.5mg  q12h use, he used most of Lorazepam 1-2x/day in the past 14 days. He is taking Sertraline 50mg  daily since 06/09/17. His mood is stabilizing in setting of Morphine prn for SOB, Furosemide 40mg  bid for CHF, Omeprazole for GERD.  Will continue Lorazepam 0.5mg  q12h prn for 14 days, then re-evaluate

## 2017-07-20 ENCOUNTER — Non-Acute Institutional Stay (SKILLED_NURSING_FACILITY): Payer: Medicare Other | Admitting: Internal Medicine

## 2017-07-20 ENCOUNTER — Encounter: Payer: Self-pay | Admitting: Internal Medicine

## 2017-07-20 DIAGNOSIS — F01518 Vascular dementia, unspecified severity, with other behavioral disturbance: Secondary | ICD-10-CM

## 2017-07-20 DIAGNOSIS — I4819 Other persistent atrial fibrillation: Secondary | ICD-10-CM

## 2017-07-20 DIAGNOSIS — I481 Persistent atrial fibrillation: Secondary | ICD-10-CM | POA: Diagnosis not present

## 2017-07-20 DIAGNOSIS — I5040 Unspecified combined systolic (congestive) and diastolic (congestive) heart failure: Secondary | ICD-10-CM

## 2017-07-20 DIAGNOSIS — F0151 Vascular dementia with behavioral disturbance: Secondary | ICD-10-CM

## 2017-07-20 DIAGNOSIS — E876 Hypokalemia: Secondary | ICD-10-CM

## 2017-07-20 NOTE — Progress Notes (Signed)
Location:  [place] Room Number: 50 Place of Service:  SNF (31) Provider:  Blanchie Serve MD  Blanchie Serve, MD  Patient Care Team: Blanchie Serve, MD as PCP - General (Internal Medicine) Mast, Man X, NP as Nurse Practitioner (Internal Medicine)  Extended Emergency Contact Information Primary Emergency Contact: [facility] Address: [address] [place]          [place], [place] [address] [place] Phone: [phone] Relation: Spouse Secondary Emergency Contact: Gibson,Sarah Address: [place]          [place], [place] [address] [place] Phone: [phone] Relation: Other  Code Status:  DNR/ do not hospitalize  Goals of care: Advanced Directive information Advanced Directives 07/20/2017  Does Patient Have a Medical Advance Directive? Yes  Type of Advance Directive Out of facility DNR (pink MOST or yellow form)  Does patient want to make changes to medical advance directive? No - Patient declined  Copy of [place] in Chart? -  Pre-existing out of facility DNR order (yellow form or pink MOST form) Yellow form placed in chart (order not valid for inpatient use);Pink MOST form placed in chart (order not valid for inpatient use)     Chief Complaint  Patient presents with  . Medical Management of Chronic Issues    Routine Visit     HPI:  Pt is a 84 y.o. male seen today for medical management of chronic diseases. He is seen in his room. He had his lunch. He appears comfortable. He has o2 by nasal canula. His memory and hearing impairment limits his HPI and ROS.  He had a fall on 07/04/17 and had laceration to left forehead and required staples that have now been removed. No further falls reported. No acute concern from nursing.    Past Medical History:  Diagnosis Date  . A-fib ([place])   . Anemia   . Arthritis   . Bandemia   . Benign neoplasm of colon   . Blood glucose elevated   .  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 Corns and callosities   . Dysphagia   . Hyperlipemia   . Hypertension   . Hyposmolality syndrome   . Long term (current) use of anticoagulants   . Malignant neoplasm of prostate ([place])   . Memory loss   . Multiple myeloma in remission ([place])   . Pacemaker   . Post-traumatic osteoarthritis of left hip    Past Surgical History:  Procedure Laterality Date  . EYE SURGERY     cataract surgery  . FRACTURE SURGERY     left hemiarthroplasty 10/15/13 Dr. Alfredia Client  . HEMORRHOID SURGERY    . JOINT REPLACEMENT     right knee replacement by Dr. Onnie Graham  . PACEMAKER PLACEMENT    . PROSTATE SURGERY      Allergies  Allergen Reactions  . Codeine   . Hctz [Hydrochlorothiazide]   . Lipitor [Atorvastatin]   . Quinine Derivatives   . Sulfa Antibiotics     Outpatient Encounter Medications as of 07/20/2017  Medication Sig  . acetaminophen (TYLENOL) 325 MG tablet Take 325 mg by mouth at bedtime as needed.   Marland Kitchen acetaminophen (TYLENOL) 325 MG tablet Take 650 mg by mouth every 6 (six) hours as needed.  Marland Kitchen acyclovir (ZOVIRAX) 400 MG tablet Take 400 mg by mouth daily.  Marland Kitchen amoxicillin (AMOXIL) 500 MG tablet Take 500 mg by  mouth as needed. For use with dental appointments.  . calcium carbonate (OSCAL) 1500 (600 Ca) MG TABS tablet Take 600 mg of elemental calcium by mouth daily.  . ferrous sulfate 325 (65 FE) MG EC tablet Take 325 mg by mouth daily with breakfast.  . furosemide (LASIX) 40 MG tablet Take 40 mg by mouth 2 (two) times daily.  Marland Kitchen ipratropium-albuterol (DUONEB) 0.5-2.5 (3) MG/3ML SOLN Take 3 mLs by nebulization every 6 (six) hours as needed (For decreased saturations and wheezing).  . LORazepam (ATIVAN) 0.5 MG tablet Take 0.5 mg by mouth every 12 (twelve) hours as needed for anxiety.   . Menthol, Topical Analgesic, (BIOFREEZE) 4 % GEL Apply 1 application topically 2 (two) times daily as needed.   . metoprolol tartrate (LOPRESSOR)  25 MG tablet Take 12.5 mg by mouth daily.   . MORPHINE SULFATE PO Take 0.25 mLs by mouth every 2 (two) hours as needed.  . nitroGLYCERIN (NITROSTAT) 0.4 MG SL tablet Place 0.4 mg every 5 (five) minutes as needed under the tongue for chest pain.  Marland Kitchen omeprazole (PRILOSEC) 20 MG capsule Take 20 mg by mouth daily.  . OXYGEN Inhale 2 L into the lungs continuous.  . Polyvinyl Alcohol-Povidone (REFRESH OP) Apply 1 drop to eye as needed.  . potassium chloride SA (K-DUR,KLOR-CON) 20 MEQ tablet Take 20 mEq by mouth daily.  Marland Kitchen senna-docusate (SENOKOT-S) 8.6-50 MG per tablet Take 1 tablet by mouth daily as needed.   . sertraline (ZOLOFT) 50 MG tablet Take 75 mg by mouth daily.   Marland Kitchen warfarin (COUMADIN) 2 MG tablet Take 2 mg by mouth one time only at 6 PM.  . [DISCONTINUED] NON FORMULARY Take by mouth 2 (two) times daily as needed (Morphine-Give 5 mg every 2 hours as needed for respiratory distress.).  . [DISCONTINUED] sertraline (ZOLOFT) 25 MG tablet Take 25 mg by mouth daily. Give 75 mg by mouth daily.   No facility-administered encounter medications on file as of 07/20/2017.     Review of Systems  Constitutional: Negative for appetite change, chills and fever.  HENT: Negative for congestion and rhinorrhea.   Respiratory: Positive for shortness of breath. Negative for cough.   Cardiovascular: Positive for leg swelling. Negative for chest pain and palpitations.  Gastrointestinal: Negative for abdominal pain and constipation.  Genitourinary: Negative for dysuria.  Musculoskeletal: Positive for arthralgias and gait problem.  Hematological: Bruises/bleeds easily.  Psychiatric/Behavioral: Positive for confusion. Negative for behavioral problems. The patient is nervous/anxious.     Immunization History  Administered Date(s) Administered  . Influenza, High Dose Seasonal PF 03/05/2017  . Pneumococcal Conjugate-13 04/08/2000  . Pneumococcal Polysaccharide-23 12/07/2013  . Td 11/11/2012  . Tdap 08/23/2001    . Zoster 06/05/2011   Pertinent  Health Maintenance Due  Topic Date Due  . INFLUENZA VACCINE  Completed  . PNA vac Low Risk Adult  Completed   Fall Risk  06/10/2017  Falls in the past year? Yes  Number falls in past yr: 2 or more  Injury with Fall? No   Functional Status Survey:    Vitals:   07/20/17 1146  BP: 115/70  Pulse: 64  Resp: 18  Temp: 97.6 F (36.4 C)  TempSrc: Oral  SpO2: 96%  Weight: 139 lb 6.4 oz (63.2 kg)  Height: 5' 9"  (1.753 m)   Body mass index is 20.59 kg/m.   Wt Readings from Last 3 Encounters:  07/20/17 139 lb 6.4 oz (63.2 kg)  07/19/17 137 lb 4.8 oz (62.3 kg)  07/06/17  143 lb 11.2 oz (65.2 kg)   Physical Exam  Constitutional:  Frail, elderly male, in no acute distress  HENT:  Head: Normocephalic and atraumatic.  Mouth/Throat: Oropharynx is clear and moist.  Eyes: Conjunctivae are normal. Pupils are equal, round, and reactive to light. Right eye exhibits no discharge. Left eye exhibits no discharge.  Neck: Neck supple.  Cardiovascular: Normal rate.  Murmur heard. Pulmonary/Chest: Effort normal. He has no wheezes. He has no rales.  On oxygen by nasal canula, poor air movement  Abdominal: Soft. Bowel sounds are normal. There is no tenderness.  Musculoskeletal: He exhibits edema and deformity.  Trace leg edema, able to move all 4 extremities, unsteady gait, ambulates with wheelchair  Lymphadenopathy:    He has no cervical adenopathy.  Neurological: He is alert.  Oriented to person only  Skin: Skin is warm and dry. No rash noted. He is not diaphoretic.    Labs reviewed: Recent Labs    04/21/17 1108 05/04/17 05/19/17 06/23/17 06/29/17  NA 138 134* 137 133* 135*  K 3.7 3.9 4.2 4.7 3.6  CL 102  --   --   --   --   CO2 29  --   --   --   --   GLUCOSE 110*  --   --   --   --   BUN 28* 23* 29* 23* 16  CREATININE 0.86 0.8  --  0.8 0.7  CALCIUM 8.9  --  9.0  --   --    Recent Labs    03/09/17 03/18/17 05/19/17  AST 21 16 24   ALT 29 20  25   ALKPHOS 79 62 73  BILITOT  --   --  1.1  PROT  --   --  5.5  ALBUMIN  --   --  3.5   Recent Labs    03/23/17 04/21/17 1108 05/19/17 06/24/17  WBC  --  13.2* 24 8.1  NEUTROABS  --  10.0*  --   --   HGB 12.9* 12.9* 12.4 10.8*  HCT 38* 40.2 36.6 31*  MCV  --  92.4  --   --   PLT 229 191  --  164   No results found for: TSH No results found for: HGBA1C Lab Results  Component Value Date   CHOL 141 05/18/2017   HDL 57 05/18/2017   LDLCALC 74 05/18/2017   TRIG 36 (A) 05/18/2017    Significant Diagnostic Results in last 30 days:  No results found.  Assessment/Plan  CHF Recent CHF exacerbation on chest xray review from 06/22/17. S/p diuresis and currently on furosemide 40 mg bid with prn duoneb. Weight reviewed. On kcl supplement. euvolemic this exam. Monitor. Continue metoprolol tartrate 12.5 mg daily. Continue morphine 5 mg every 2 hour as needed for dyspnea and monitor.   Dementia with behavioral disturbance Calm this visit. Supportive care along with sertraline 75 mg daily  afib Controlled HR, continue metoprolol tartrate 12.5 mg daily for rate control and warfarin 2 mg daily. Goal INR 2-3  Hypokalemia Continue kcl supplement   Family/ staff Communication: reviewed care plan with patient and charge nurse.    Labs/tests ordered:  none   Blanchie Serve, MD Internal Medicine [facility] [address] [place], [place] [address] Cell Phone (Monday-Friday 8 am - 5 pm): [phone] On Call: [phone] and follow prompts after 5 pm and on weekends Office Phone: [phone] Office Fax: [phone]

## 2017-07-29 ENCOUNTER — Non-Acute Institutional Stay (SKILLED_NURSING_FACILITY): Payer: Medicare Other | Admitting: Nurse Practitioner

## 2017-07-29 ENCOUNTER — Encounter: Payer: Self-pay | Admitting: Nurse Practitioner

## 2017-07-29 DIAGNOSIS — R2681 Unsteadiness on feet: Secondary | ICD-10-CM

## 2017-07-29 DIAGNOSIS — R509 Fever, unspecified: Secondary | ICD-10-CM | POA: Diagnosis not present

## 2017-07-29 DIAGNOSIS — M25532 Pain in left wrist: Secondary | ICD-10-CM

## 2017-07-29 DIAGNOSIS — S60212A Contusion of left wrist, initial encounter: Secondary | ICD-10-CM | POA: Insufficient documentation

## 2017-07-29 NOTE — Assessment & Plan Note (Signed)
Infection process vs injury/stress, his goal of care is comfort measures, continue Tylenol. Morphine, Lorazepam. Observe.

## 2017-07-29 NOTE — Progress Notes (Signed)
Location:  [facility] Room Number: 37 Place of Service:  SNF (31) Provider:  Mast, Manxie  NP  Blanchie Serve, MD  Patient Care Team: Blanchie Serve, MD as PCP - General (Internal Medicine) Mast, Man X, NP as Nurse Practitioner (Internal Medicine)  Extended Emergency Contact Information Primary Emergency Contact: [facility] Address: [address] [place]          [place], [place] [address] [place] Phone: [phone] Relation: Spouse Secondary Emergency Contact: Gibson,Sarah Address: [address]          [place],  [address] [place] Litter of [place] Phone: [phone] Relation: Other  Code Status:  DNR Goals of care: Advanced Directive information Advanced Directives 07/29/2017  Does Patient Have a Medical Advance Directive? Yes  Type of Advance Directive Out of facility DNR (pink MOST or yellow form)  Does patient want to make changes to medical advance directive? No - Patient declined  Copy of [place] in Chart? No - copy requested  Pre-existing out of facility DNR order (yellow form or pink MOST form) Yellow form placed in chart (order not valid for inpatient use);Pink MOST form placed in chart (order not valid for inpatient use)     Chief Complaint  Patient presents with  . Acute Visit    left wrist pain    HPI:  Pt is a 84 y.o. male seen today for an acute visit for left wrist pain, swollen, reddened, fever, pain when touched or moved sustained from the fall 07/28/17 when he was on way to bathroom 07/27/17. The patient's goal of care is comfort measures. He is weak, wheelchair for mobility, O2 dependent. His has memory difficulties. Resides in SNF, has Morphine prn available to him. Staff reported he has low grade temperature.    Past Medical History:  Diagnosis Date  . A-fib ([place])   . Anemia   . Arthritis   . Bandemia   . Benign neoplasm of colon   . Blood glucose elevated   . 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 Corns and callosities   . Dysphagia   . Hyperlipemia   . Hypertension   . Hyposmolality syndrome   . Long term (current) use of anticoagulants   . Malignant neoplasm of prostate ([place])   . Memory loss   . Multiple myeloma in remission ([place])   . Pacemaker   . Post-traumatic osteoarthritis of left hip    Past Surgical History:  Procedure Laterality Date  . EYE SURGERY     cataract surgery  . FRACTURE SURGERY     left hemiarthroplasty 10/15/13 Dr. Alfredia Client  . HEMORRHOID SURGERY    . JOINT REPLACEMENT     right knee replacement by Dr. Onnie Graham  . PACEMAKER PLACEMENT    . PROSTATE SURGERY      Allergies  Allergen Reactions  . Codeine   . Hctz [Hydrochlorothiazide]   . Lipitor [Atorvastatin]   . Quinine Derivatives   . Sulfa Antibiotics     Outpatient Encounter Medications as of 07/29/2017  Medication Sig  . acetaminophen (TYLENOL) 325 MG tablet Take 325 mg by mouth at bedtime as needed.   Marland Kitchen acetaminophen (TYLENOL) 325 MG tablet Take 650 mg by mouth every 6 (six) hours as needed.  Marland Kitchen acyclovir (ZOVIRAX) 400 MG tablet Take 400 mg by mouth daily.  Marland Kitchen amoxicillin (AMOXIL) 500 MG tablet Take 500 mg by mouth as needed.  For use with dental appointments.  . calcium carbonate (OSCAL) 1500 (600 Ca) MG TABS tablet Take 600 mg of elemental calcium by mouth daily.  . ferrous sulfate 325 (65 FE) MG EC tablet Take 325 mg by mouth daily with breakfast.  . furosemide (LASIX) 40 MG tablet Take 40 mg by mouth 2 (two) times daily.  Marland Kitchen ipratropium-albuterol (DUONEB) 0.5-2.5 (3) MG/3ML SOLN Take 3 mLs by nebulization every 6 (six) hours as needed (For decreased saturations and wheezing).  . LORazepam (ATIVAN) 0.5 MG tablet Take 0.5 mg by mouth every 12 (twelve) hours as needed for anxiety.   . Menthol, Topical Analgesic, (BIOFREEZE) 4 % GEL Apply 1 application topically 2 (two) times daily as needed.   . metoprolol tartrate (LOPRESSOR) 25 MG  tablet Take 12.5 mg by mouth daily.   . MORPHINE SULFATE PO Take 0.25 mLs by mouth every 2 (two) hours as needed.  . nitroGLYCERIN (NITROSTAT) 0.4 MG SL tablet Place 0.4 mg every 5 (five) minutes as needed under the tongue for chest pain.  Marland Kitchen omeprazole (PRILOSEC) 20 MG capsule Take 20 mg by mouth daily.  . OXYGEN Inhale 2 L into the lungs continuous.  . Polyvinyl Alcohol-Povidone (REFRESH OP) Apply 1 drop to eye as needed.  . potassium chloride SA (K-DUR,KLOR-CON) 20 MEQ tablet Take 20 mEq by mouth daily.  Marland Kitchen senna-docusate (SENOKOT-S) 8.6-50 MG per tablet Take 1 tablet by mouth daily as needed.   . sertraline (ZOLOFT) 25 MG tablet Take 25 mg by mouth daily. Combine a 52m tablet and a 50 mg tablet to equal 75 mg.  . sertraline (ZOLOFT) 50 MG tablet Take 75 mg by mouth daily.   .Marland Kitchenwarfarin (COUMADIN) 2 MG tablet Take 2 mg by mouth one time only at 6 PM.   No facility-administered encounter medications on file as of 07/29/2017.     Review of Systems  Constitutional: Positive for activity change, appetite change, fatigue and fever. Negative for chills and diaphoresis.  HENT: Positive for hearing loss. Negative for congestion.   Respiratory: Positive for cough and shortness of breath. Negative for choking, chest tightness and wheezing.   Cardiovascular: Positive for leg swelling. Negative for chest pain and palpitations.  Musculoskeletal: Positive for arthralgias, gait problem and joint swelling.  Skin: Positive for color change. Negative for pallor, rash and wound.  Neurological: Negative for tremors, speech difficulty and weakness.       Memory difficulties.   Hematological: Bruises/bleeds easily.  Psychiatric/Behavioral: Positive for confusion. Negative for agitation, behavioral problems and sleep disturbance. The patient is nervous/anxious.     Immunization History  Administered Date(s) Administered  . Influenza, High Dose Seasonal PF 03/05/2017  . Pneumococcal Conjugate-13 04/08/2000  .  Pneumococcal Polysaccharide-23 12/07/2013  . Td 11/11/2012  . Tdap 08/23/2001  . Zoster 06/05/2011   Pertinent  Health Maintenance Due  Topic Date Due  . INFLUENZA VACCINE  Completed  . PNA vac Low Risk Adult  Completed   Fall Risk  06/10/2017  Falls in the past year? Yes  Number falls in past yr: 2 or more  Injury with Fall? No   Functional Status Survey:    Vitals:   07/29/17 1121  BP: 115/70  Pulse: 72  Resp: 20  Temp: 97.9 F (36.6 C)  SpO2: 95%  Weight: 140 lb 12.8 oz (63.9 kg)  Height: _0  (1.753 m)   Body mass index is 20.79 kg/m. Physical Exam  Constitutional: He appears well-developed and well-nourished. No distress.  HENT:  Head: Normocephalic and atraumatic.  Eyes: Conjunctivae and EOM are normal. Pupils are equal, round, and reactive to light.  Neck: Normal range of motion. Neck supple. No JVD present.  Cardiovascular: Normal rate.  Murmur heard. Pulmonary/Chest: He has no wheezes. He has rales.  O2 dependent, decreased air entry  Abdominal: Soft. Bowel sounds are normal.  Musculoskeletal: He exhibits edema and tenderness.  Trace edema BLE. Swollen left wrist and left hand. Redness, warmth left wrist. Pain when the left wrist palpated or moved  Neurological: He is alert. He exhibits normal muscle tone. Coordination normal.  Oriented to person   Skin: Skin is warm and dry. No rash noted. He is not diaphoretic. There is erythema.  Psychiatric: He has a normal mood and affect. His behavior is normal.    Labs reviewed: Recent Labs    04/21/17 1108 05/04/17 05/19/17 06/23/17 06/29/17  NA 138 134* 137 133* 135*  K 3.7 3.9 4.2 4.7 3.6  CL 102  --   --   --   --   CO2 29  --   --   --   --   GLUCOSE 110*  --   --   --   --   BUN 28* 23* 29* 23* 16  CREATININE 0.86 0.8  --  0.8 0.7  CALCIUM 8.9  --  9.0  --   --    Recent Labs    03/09/17 03/18/17 05/19/17  AST _0 ALT _1 ALKPHOS 79 62 73  BILITOT  --   --  1.1  PROT  --   --  5.5   ALBUMIN  --   --  3.5   Recent Labs    03/23/17 04/21/17 1108 05/19/17 06/24/17  WBC  --  13.2* 24 8.1  NEUTROABS  --  10.0*  --   --   HGB 12.9* 12.9* 12.4 10.8*  HCT 38* 40.2 36.6 31*  MCV  --  92.4  --   --   PLT 229 191  --  164   No results found for: TSH No results found for: HGBA1C Lab Results  Component Value Date   CHOL 141 05/18/2017   HDL 57 05/18/2017   LDLCALC 74 05/18/2017   TRIG 36 (A) 05/18/2017    Significant Diagnostic Results in last 30 days:  No results found.  Assessment/Plan Acute pain of left wrist left wrist pain, swollen, reddened, fever, pain was noted when the area is touched or moved sustained from the fall 07/28/17 when he was on way to bathroom 07/27/17. The patient's goal of care is comfort measures. He is weak, wheelchair for mobility, O2 dependent. His has memory difficulties. Resides in SNF, has Morphine prn available to him. Will immobilized the left wrist, obtain X-ray of the left wrist in ap, lateral, and oblique views.    Unsteady gait He is weak, wheelchair for mobility, O2 dependent. His has memory difficulties. Resides in SNF, has Morphine prn available to him. Intensive supervision for safety.    Fever Infection process vs injury/stress, his goal of care is comfort measures, continue Tylenol. Morphine, Lorazepam. Observe.      Family/ staff Communication: plan of care reviewed with the patient, patient's POA wife, and charge nurse.   Labs/tests ordered: X ray left wrist ap, lateral, oblique views  Time spend 25 minutes.

## 2017-07-29 NOTE — Assessment & Plan Note (Signed)
He is weak, wheelchair for mobility, O2 dependent. His has memory difficulties. Resides in SNF, has Morphine prn available to him. Intensive supervision for safety.

## 2017-07-29 NOTE — Assessment & Plan Note (Signed)
left wrist pain, swollen, reddened, fever, pain was noted when the area is touched or moved sustained from the fall 07/28/17 when he was on way to bathroom 07/27/17. The patient's goal of care is comfort measures. He is weak, wheelchair for mobility, O2 dependent. His has memory difficulties. Resides in SNF, has Morphine prn available to him. Will immobilized the left wrist, obtain X-ray of the left wrist in ap, lateral, and oblique views.

## 2017-08-03 ENCOUNTER — Non-Acute Institutional Stay (SKILLED_NURSING_FACILITY): Payer: Medicare Other | Admitting: Nurse Practitioner

## 2017-08-03 ENCOUNTER — Encounter: Payer: Self-pay | Admitting: Nurse Practitioner

## 2017-08-03 DIAGNOSIS — S60212D Contusion of left wrist, subsequent encounter: Secondary | ICD-10-CM | POA: Diagnosis not present

## 2017-08-03 DIAGNOSIS — F418 Other specified anxiety disorders: Secondary | ICD-10-CM

## 2017-08-03 NOTE — Assessment & Plan Note (Addendum)
Left wrist is improving, able to move without pain, resolving redness and swelling, X-ray was negative for acute fractures. Prn Morphine is available to him for comfort measures. Will discontinue left wrist immobilizer.

## 2017-08-03 NOTE — Progress Notes (Signed)
Location:  [facility] Room Number: 78 Place of Service:  SNF (31) Provider:  Kalin Kyler, Manxie  NP  Blanchie Serve, MD  Patient Care Team: Blanchie Serve, MD as PCP - General (Internal Medicine) Darothy Courtright X, NP as Nurse Practitioner (Internal Medicine)  Extended Emergency Contact Information Primary Emergency Contact: [facility] Address: [address] [place]          [place], [place] [address] [place] Phone: [phone] Relation: Spouse Secondary Emergency Contact: Gibson,Sarah Address: [address]          [place], [place] [address] [place] of [place] Phone: [phone] Relation: Other  Code Status:  DNR Goals of care: Advanced Directive information Advanced Directives 08/03/2017  Does Patient Have a Medical Advance Directive? Yes  Type of Advance Directive Out of facility DNR (pink MOST or yellow form);Living will  Does patient want to make changes to medical advance directive? No - Patient declined  Copy of [place] in Chart? No - copy requested  Pre-existing out of facility DNR order (yellow form or pink MOST form) Yellow form placed in chart (order not valid for inpatient use)     Chief Complaint  Patient presents with  . Acute Visit    re-evaluation the need for Ativan    HPI:  Pt is a 84 y.o. male seen today for an acute visit for anxiety, is better managed with Sertraline 67m qd, he still needs Lorazepam 0.522mprn for anxiety and restlessness throughout day. Left wrist is improving, able to move without pain, resolving redness and swelling, X-ray was negative for acute fractures.    Past Medical History:  Diagnosis Date  . [place]-fib ([place]  . Anemia   . Arthritis   . Bandemia   . Benign neoplasm of colon   . Blood glucose elevated   . Chronic thromboembolism of deep veins of proximal leg ([facility]  . Corns and callosities   . Dysphagia   . Hyperlipemia   . Hypertension   .  Hyposmolality syndrome   . Long term (current) use of anticoagulants   . Malignant neoplasm of prostate ([facility]  . Memory loss   . Multiple myeloma in remission ([facility]  . Pacemaker   . Post-traumatic osteoarthritis of left hip    Past Surgical History:  Procedure Laterality Date  . EYE SURGERY     cataract surgery  . FRACTURE SURGERY     left hemiarthroplasty 10/15/13 Dr. KeAlfredia Client. HEMORRHOID SURGERY    . JOINT REPLACEMENT     right knee replacement by Dr. WeOnnie Graham. PACEMAKER PLACEMENT    . PROSTATE SURGERY      Allergies  Allergen Reactions  . Codeine   . Hctz [Hydrochlorothiazide]   . Lipitor [Atorvastatin]   . Quinine Derivatives   . Sulfa Antibiotics     Outpatient Encounter Medications as of 08/03/2017  Medication Sig  . acetaminophen (TYLENOL) 325 MG tablet Take 325 mg by mouth at bedtime as needed.   . Marland Kitchencetaminophen (TYLENOL) 325 MG tablet Take 650 mg by mouth every 6 (six) hours as needed.  . Marland Kitchencyclovir (ZOVIRAX) 400 MG tablet Take 400 mg by mouth daily.  . Marland Kitchenmoxicillin (AMOXIL) 500 MG tablet Take 500 mg by mouth as needed. For use with dental appointments.  . calcium carbonate (OSCAL) 1500 (600 Ca) MG TABS tablet Take 600 mg of elemental calcium by mouth daily.  . ferrous sulfate  325 (65 FE) MG EC tablet Take 325 mg by mouth daily with breakfast.  . furosemide (LASIX) 40 MG tablet Take 40 mg by mouth 2 (two) times daily.  Marland Kitchen ipratropium-albuterol (DUONEB) 0.5-2.5 (3) MG/3ML SOLN Take 3 mLs by nebulization every 6 (six) hours as needed (For decreased saturations and wheezing).  . LORazepam (ATIVAN) 0.5 MG tablet Take 0.5 mg by mouth every 12 (twelve) hours as needed for anxiety.   . Menthol, Topical Analgesic, (BIOFREEZE) 4 % GEL Apply 1 application topically 2 (two) times daily as needed.   . metoprolol tartrate (LOPRESSOR) 25 MG tablet Take 12.5 mg by mouth daily.   . MORPHINE SULFATE PO Take 0.25 mLs by mouth every 2 (two) hours as needed.  . nitroGLYCERIN  (NITROSTAT) 0.4 MG SL tablet Place 0.4 mg every 5 (five) minutes as needed under the tongue for chest pain.  Marland Kitchen omeprazole (PRILOSEC) 20 MG capsule Take 20 mg by mouth daily.  . OXYGEN Inhale 2 L into the lungs continuous.  . Polyvinyl Alcohol-Povidone (REFRESH OP) Apply 1 drop to eye as needed.  . potassium chloride SA (K-DUR,KLOR-CON) 20 MEQ tablet Take 20 mEq by mouth daily.  Marland Kitchen senna-docusate (SENOKOT-S) 8.6-50 MG per tablet Take 1 tablet by mouth daily as needed.   . sertraline (ZOLOFT) 25 MG tablet Take 25 mg by mouth daily. Combine a 9m tablet and a 50 mg tablet to equal 75 mg.  . sertraline (ZOLOFT) 50 MG tablet Take 75 mg by mouth daily.   .Marland Kitchenwarfarin (COUMADIN) 2 MG tablet Take 2 mg by mouth one time only at 6 PM.   No facility-administered encounter medications on file as of 08/03/2017.    ROS was provided with assistance of staff.  Review of Systems  Constitutional: Negative for activity change, appetite change, chills, diaphoresis, fatigue and fever.  HENT: Positive for hearing loss and trouble swallowing. Negative for congestion and voice change.   Respiratory: Positive for cough and shortness of breath. Negative for wheezing.   Cardiovascular: Positive for leg swelling. Negative for chest pain and palpitations.  Musculoskeletal: Positive for gait problem.       Left wrist pain is better, able to use his left hand now  Skin: Positive for color change.       Mild, resolving, residual left wrist redness  Neurological: Negative for tremors and weakness.       Dementia.   Psychiatric/Behavioral: Positive for confusion. Negative for sleep disturbance. The patient is nervous/anxious.     Immunization History  Administered Date(s) Administered  . Influenza, High Dose Seasonal PF 03/05/2017  . Pneumococcal Conjugate-13 04/08/2000  . Pneumococcal Polysaccharide-23 12/07/2013  . Td 11/11/2012  . Tdap 08/23/2001  . Zoster 06/05/2011   Pertinent  Health Maintenance Due  Topic  Date Due  . INFLUENZA VACCINE  Completed  . PNA vac Low Risk Adult  Completed   Fall Risk  06/10/2017  Falls in the past year? Yes  Number falls in past yr: 2 or more  Injury with Fall? No   Functional Status Survey:    Vitals:   08/03/17 1239  BP: 118/70  Pulse: 66  Resp: 14  Temp: 97.8 F (36.6 C)  SpO2: 96%  Weight: 142 lb 11.2 oz (64.7 kg)  Height: 5' 9"  (1.753 m)   Body mass index is 21.07 kg/m. Physical Exam  Constitutional: He appears well-developed and well-nourished.  HENT:  Head: Normocephalic and atraumatic.  Neck: No JVD present.  Cardiovascular: Normal rate and regular rhythm.  Murmur heard. Pulmonary/Chest: Effort normal. He has rales.  O2 dependent, decreased air entry, bibasilar rales.   Musculoskeletal: He exhibits edema and tenderness.  Resolving swelling, redness, pain in the left wrist, he is able to use left hand now.   Neurological: He is alert. He exhibits normal muscle tone. Coordination normal.  Oriented to person and place.   Skin: Skin is warm. There is erythema.  Left wrist residual of redness and swelling and warmth. mild  Psychiatric: His behavior is normal.  The patient has mild anxious and restlessness upon my visit.     Labs reviewed: Recent Labs    04/21/17 1108 05/04/17 05/19/17 06/23/17 06/29/17  NA 138 134* 137 133* 135*  K 3.7 3.9 4.2 4.7 3.6  CL 102  --   --   --   --   CO2 29  --   --   --   --   GLUCOSE 110*  --   --   --   --   BUN 28* 23* 29* 23* 16  CREATININE 0.86 0.8  --  0.8 0.7  CALCIUM 8.9  --  9.0  --   --    Recent Labs    03/09/17 03/18/17 05/19/17  AST 21 16 24   ALT 29 20 25   ALKPHOS 79 62 73  BILITOT  --   --  1.1  PROT  --   --  5.5  ALBUMIN  --   --  3.5   Recent Labs    03/23/17 04/21/17 1108 05/19/17 06/24/17  WBC  --  13.2* 24 8.1  NEUTROABS  --  10.0*  --   --   HGB 12.9* 12.9* 12.4 10.8*  HCT 38* 40.2 36.6 31*  MCV  --  92.4  --   --   PLT 229 191  --  164   No results found for:  TSH No results found for: HGBA1C Lab Results  Component Value Date   CHOL 141 05/18/2017   HDL 57 05/18/2017   LDLCALC 74 05/18/2017   TRIG 36 (A) 05/18/2017    Significant Diagnostic Results in last 30 days:  No results found.  Assessment/Plan Situational anxiety anxiety, is better managed with Sertraline 77m qd, he still needs Lorazepam 0.580mprn for anxiety and restlessness throughout day. Will re-evaluate in 14 days.   Contusion of left wrist Left wrist is improving, able to move without pain, resolving redness and swelling, X-ray was negative for acute fractures. Prn Morphine is available to him for comfort measures. Will discontinue left wrist immobilizer.        Family/ staff Communication: plan of care reviewed with the patient and charge nurse.   Labs/tests ordered:  None  Time spend 25 minutes.

## 2017-08-03 NOTE — Assessment & Plan Note (Signed)
anxiety, is better managed with Sertraline 75mg  qd, he still needs Lorazepam 0.5mg  prn for anxiety and restlessness throughout day. Will re-evaluate in 14 days.

## 2017-08-17 ENCOUNTER — Non-Acute Institutional Stay (SKILLED_NURSING_FACILITY): Payer: Medicare Other | Admitting: Nurse Practitioner

## 2017-08-17 ENCOUNTER — Encounter: Payer: Self-pay | Admitting: Nurse Practitioner

## 2017-08-17 DIAGNOSIS — I5040 Unspecified combined systolic (congestive) and diastolic (congestive) heart failure: Secondary | ICD-10-CM | POA: Diagnosis not present

## 2017-08-17 DIAGNOSIS — F418 Other specified anxiety disorders: Secondary | ICD-10-CM | POA: Diagnosis not present

## 2017-08-17 DIAGNOSIS — I10 Essential (primary) hypertension: Secondary | ICD-10-CM | POA: Diagnosis not present

## 2017-08-17 DIAGNOSIS — D4989 Neoplasm of unspecified behavior of other specified sites: Secondary | ICD-10-CM | POA: Diagnosis not present

## 2017-08-17 DIAGNOSIS — I481 Persistent atrial fibrillation: Secondary | ICD-10-CM

## 2017-08-17 DIAGNOSIS — I4819 Other persistent atrial fibrillation: Secondary | ICD-10-CM

## 2017-08-17 NOTE — Assessment & Plan Note (Signed)
The patient has history of HTN, blood pressure is controlled, continue  Metoprolol Succinate12.5mg  qd/tartrate.

## 2017-08-17 NOTE — Assessment & Plan Note (Signed)
CHF, compensated, trace edema in BLE, continue Furosemide 40mg  bid.

## 2017-08-17 NOTE — Assessment & Plan Note (Signed)
He has history of Plasma cell neoplasm-Myeloma, currently taking Acyclovir, not taking cancer treatment presently. Contact Oncology to clarify if continue Acyclovir is needed

## 2017-08-17 NOTE — Progress Notes (Addendum)
Location:  [facility] Room Number: 53 Place of Service:  SNF (31) Provider:  Hessie Varone, Manxie  NP  Blanchie Serve, MD  Patient Care Team: Blanchie Serve, MD as PCP - General (Internal Medicine) Floraine Buechler X, NP as Nurse Practitioner (Internal Medicine)  Extended Emergency Contact Information Primary Emergency Contact: [facility] Address: [address]          [place], [place] [address] [place] Phone: [phone] Relation: Spouse Secondary Emergency Contact: Gibson,Sarah Address: [address]          [place], [place] [address] [place] of [place] Phone: [phone] Relation: Other  Code Status:  DNR Goals of care: Advanced Directive information Advanced Directives 08/03/2017  Does Patient Have a Medical Advance Directive? Yes  Type of Advance Directive Out of facility DNR (pink MOST or yellow form);Living will  Does patient want to make changes to medical advance directive? No - Patient declined  Copy of [place] in Chart? No - copy requested  Pre-existing out of facility DNR order (yellow form or pink MOST form) Yellow form placed in chart (order not valid for inpatient use)     Chief Complaint  Patient presents with  . Medical Management of Chronic Issues    HPI:  Pt is a 84 y.o. male seen today for medical management of chronic diseases.     The patient has history of HTN, Afib, blood pressure and heart rate are controlled on Metoprolol tartrate 12.59m qd, pharm recommend to change to Metoprolol Succinate 12.549mfor better blood pressure and heart rate control. He has history of Plasma cell neoplasm, currently taking Acyclovir, not taking cancer treatment presently. His mood is stabilizing on Sertraline 10026maily, less frequency of Lorazepam 0.5mg79mh prnused in the past 14 days. CHF, compensated, trace edema in BLE, on Furosemide 40mg51m.    Past Medical History:  Diagnosis Date  .  A-fib (HCC) [place] Anemia   . Arthritis   . Bandemia   . Benign neoplasm of colon   . Blood glucose elevated   . 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 Dysphagia   . Hyperlipemia   . Hypertension   . Hyposmolality syndrome   . Long term (current) use of anticoagulants   . Malignant neoplasm of prostate (HCC) [place] Memory loss   . Multiple myeloma in remission (HCC) [place] Pacemaker   . Post-traumatic osteoarthritis of left hip    Past Surgical History:  Procedure Laterality Date  . EYE SURGERY     cataract surgery  . FRACTURE SURGERY     left hemiarthroplasty 10/15/13 Dr. KenneAlfredia ClientEMORRHOID SURGERY    . JOINT REPLACEMENT     right knee replacement by Dr. WelleOnnie GrahamACEMAKER PLACEMENT    . PROSTATE SURGERY      Allergies  Allergen Reactions  . Codeine   . Hctz [Hydrochlorothiazide]   . Lipitor [Atorvastatin]   . Quinine Derivatives   . Sulfa Antibiotics     Outpatient Encounter Medications as of 08/17/2017  Medication Sig  . acetaminophen (TYLENOL) 325 MG tablet Take 325 mg by mouth at bedtime as needed.   . aceMarland Kitchenaminophen (TYLENOL) 325 MG tablet Take 650 mg by mouth every 6 (six) hours as needed.  . acyMarland Kitchenlovir (ZOVIRAX) 400 MG tablet Take 400 mg by mouth daily.  .Marland Kitchen  amoxicillin (AMOXIL) 500 MG tablet Take 500 mg by mouth as needed. For use with dental appointments.  . calcium carbonate (OSCAL) 1500 (600 Ca) MG TABS tablet Take 600 mg of elemental calcium by mouth daily.  . ferrous sulfate 325 (65 FE) MG EC tablet Take 325 mg by mouth daily with breakfast.  . furosemide (LASIX) 40 MG tablet Take 40 mg by mouth 2 (two) times daily.  Marland Kitchen ipratropium-albuterol (DUONEB) 0.5-2.5 (3) MG/3ML SOLN Take 3 mLs by nebulization every 6 (six) hours as needed (For decreased saturations and wheezing).  . Menthol, Topical Analgesic, (BIOFREEZE) 4 % GEL Apply 1 application topically 2 (two) times daily as needed.   . metoprolol tartrate  (LOPRESSOR) 25 MG tablet Take 12.5 mg by mouth daily.   . MORPHINE SULFATE PO Take 0.25 mLs by mouth every 2 (two) hours as needed.  . nitroGLYCERIN (NITROSTAT) 0.4 MG SL tablet Place 0.4 mg every 5 (five) minutes as needed under the tongue for chest pain.  Marland Kitchen omeprazole (PRILOSEC) 20 MG capsule Take 20 mg by mouth daily.  . OXYGEN Inhale 2 L into the lungs continuous.  . Polyvinyl Alcohol-Povidone (REFRESH OP) Apply 1 drop to eye as needed.  . potassium chloride SA (K-DUR,KLOR-CON) 20 MEQ tablet Take 20 mEq by mouth daily.  Marland Kitchen senna-docusate (SENOKOT-S) 8.6-50 MG per tablet Take 1 tablet by mouth daily as needed.   . sertraline (ZOLOFT) 25 MG tablet Take 25 mg by mouth daily. Combine a 62m tablet and a 50 mg tablet to equal 75 mg.  . sertraline (ZOLOFT) 50 MG tablet Take 75 mg by mouth daily.   .Marland Kitchenwarfarin (COUMADIN) 2 MG tablet Take 2 mg by mouth one time only at 6 PM.  . [DISCONTINUED] LORazepam (ATIVAN) 0.5 MG tablet Take 0.5 mg by mouth every 12 (twelve) hours as needed for anxiety.    No facility-administered encounter medications on file as of 08/17/2017.     Review of Systems  Constitutional: Negative for activity change, appetite change, chills, diaphoresis, fatigue and fever.  HENT: Positive for hearing loss. Negative for congestion and voice change.   Eyes: Negative for visual disturbance.  Respiratory: Positive for cough and shortness of breath. Negative for choking, chest tightness and wheezing.        O2 dependent.   Cardiovascular: Positive for leg swelling. Negative for chest pain and palpitations.  Gastrointestinal: Negative for abdominal distention, abdominal pain, constipation, diarrhea, nausea and vomiting.  Endocrine: Negative for cold intolerance.  Genitourinary: Negative for difficulty urinating, dysuria and urgency.       Incontinent of urine sometimes.   Musculoskeletal: Positive for gait problem. Negative for joint swelling.  Skin: Negative for color change and  pallor.  Neurological: Negative for tremors, speech difficulty, weakness and headaches.       Memory recall difficulties.   Psychiatric/Behavioral: Positive for confusion. Negative for agitation, behavioral problems, hallucinations and sleep disturbance. The patient is nervous/anxious.     Immunization History  Administered Date(s) Administered  . Influenza, High Dose Seasonal PF 03/05/2017  . Pneumococcal Conjugate-13 04/08/2000  . Pneumococcal Polysaccharide-23 12/07/2013  . Td 11/11/2012  . Tdap 08/23/2001  . Zoster 06/05/2011   Pertinent  Health Maintenance Due  Topic Date Due  . INFLUENZA VACCINE  Completed  . PNA vac Low Risk Adult  Completed   Fall Risk  06/10/2017  Falls in the past year? Yes  Number falls in past yr: 2 or more  Injury with Fall? No   Functional Status Survey:  Vitals:   08/17/17 1216  BP: 118/60  Pulse: 67  Resp: 20  Temp: 97.6 F (36.4 C)  SpO2: 95%  Weight: 140 lb (63.5 kg)  Height: _0  (1.753 m)   Body mass index is 20.67 kg/m. Physical Exam  Constitutional: He appears well-developed and well-nourished.  HENT:  Head: Normocephalic and atraumatic.  Eyes: Conjunctivae and EOM are normal. Pupils are equal, round, and reactive to light.  Neck: Normal range of motion. Neck supple. No JVD present. No thyromegaly present.  Cardiovascular: Normal rate.  Murmur heard. Irregular heart beats.   Pulmonary/Chest: Effort normal. He has no wheezes. He has rales.  Bibasilar rales.   Abdominal: Soft. Bowel sounds are normal. He exhibits no distension. There is no tenderness.  Musculoskeletal: Normal range of motion. He exhibits edema. He exhibits no tenderness.  Trace edema in BLE, one person transfer, wheelchair for mobility  Neurological: He is alert. He exhibits normal muscle tone. Coordination normal.  Oriented to person and place.   Skin: Skin is warm and dry.  Psychiatric: He has a normal mood and affect. His behavior is normal.     Labs reviewed: Recent Labs    04/21/17 1108 05/04/17 05/19/17 06/23/17 06/29/17  NA 138 134* 137 133* 135*  K 3.7 3.9 4.2 4.7 3.6  CL 102  --   --   --   --   CO2 29  --   --   --   --   GLUCOSE 110*  --   --   --   --   BUN 28* 23* 29* 23* 16  CREATININE 0.86 0.8  --  0.8 0.7  CALCIUM 8.9  --  9.0  --   --    Recent Labs    03/09/17 03/18/17 05/19/17  AST _1 ALT _2 ALKPHOS 79 62 73  BILITOT  --   --  1.1  PROT  --   --  5.5  ALBUMIN  --   --  3.5   Recent Labs    03/23/17 04/21/17 1108 05/19/17 06/24/17  WBC  --  13.2* 24 8.1  NEUTROABS  --  10.0*  --   --   HGB 12.9* 12.9* 12.4 10.8*  HCT 38* 40.2 36.6 31*  MCV  --  92.4  --   --   PLT 229 191  --  164   No results found for: TSH No results found for: HGBA1C Lab Results  Component Value Date   CHOL 141 05/18/2017   HDL 57 05/18/2017   LDLCALC 74 05/18/2017   TRIG 36 (A) 05/18/2017    Significant Diagnostic Results in last 30 days:  No results found.  Assessment/Plan HTN (hypertension) The patient has history of HTN, blood pressure is controlled, continue  Metoprolol Succinate12.611m qd/tartrate.   Plasma cell neoplasm He has history of Plasma cell neoplasm-Myeloma, currently taking Acyclovir, not taking cancer treatment presently. Contact Oncology to clarify if continue Acyclovir is needed  Situational anxiety His mood is stabilizing, continue Sertraline 1070mdaily, less frequency of Lorazepam 0.11m38m6h prnused in the past 14 days, will try Lorazepam 0.11mg42mh prn x 14 days, then re-evaluate.   CHF (congestive heart failure) (HCC)  CHF, compensated, trace edema in BLE, continue Furosemide 40mg72m.   A-fib Heart rate is in control, will switch from Metoprolol tartrate to metoprolol succinate 12.11mg p411maily, hold for HR<60bpm     Family/ staff Communication: plan of care reviewed with  the patient and charge nurse  Labs/tests ordered:  None  Time spend 25 minutes.

## 2017-08-17 NOTE — Assessment & Plan Note (Signed)
His mood is stabilizing, continue Sertraline 100mg  daily, less frequency of Lorazepam 0.5mg  q6h prnused in the past 14 days, will try Lorazepam 0.5mg  q8h prn x 14 days, then re-evaluate.

## 2017-08-17 NOTE — Assessment & Plan Note (Signed)
Heart rate is in control, will switch from Metoprolol tartrate to metoprolol succinate 12.5mg  po daily, hold for HR<60bpm

## 2017-08-20 DEATH — deceased

## 2017-12-25 IMAGING — CR DG CHEST 2V
2 series · 2 of 2 positions shown · non-contrast
Comparison: None in PACs

CLINICAL DATA: Left-sided chest pains noted to be greater at night
and in the morning. History of atrial fibrillation, CHF with
pulmonary edema, prostate malignancy

EXAM:
CHEST  2 VIEW

[w chest pa]
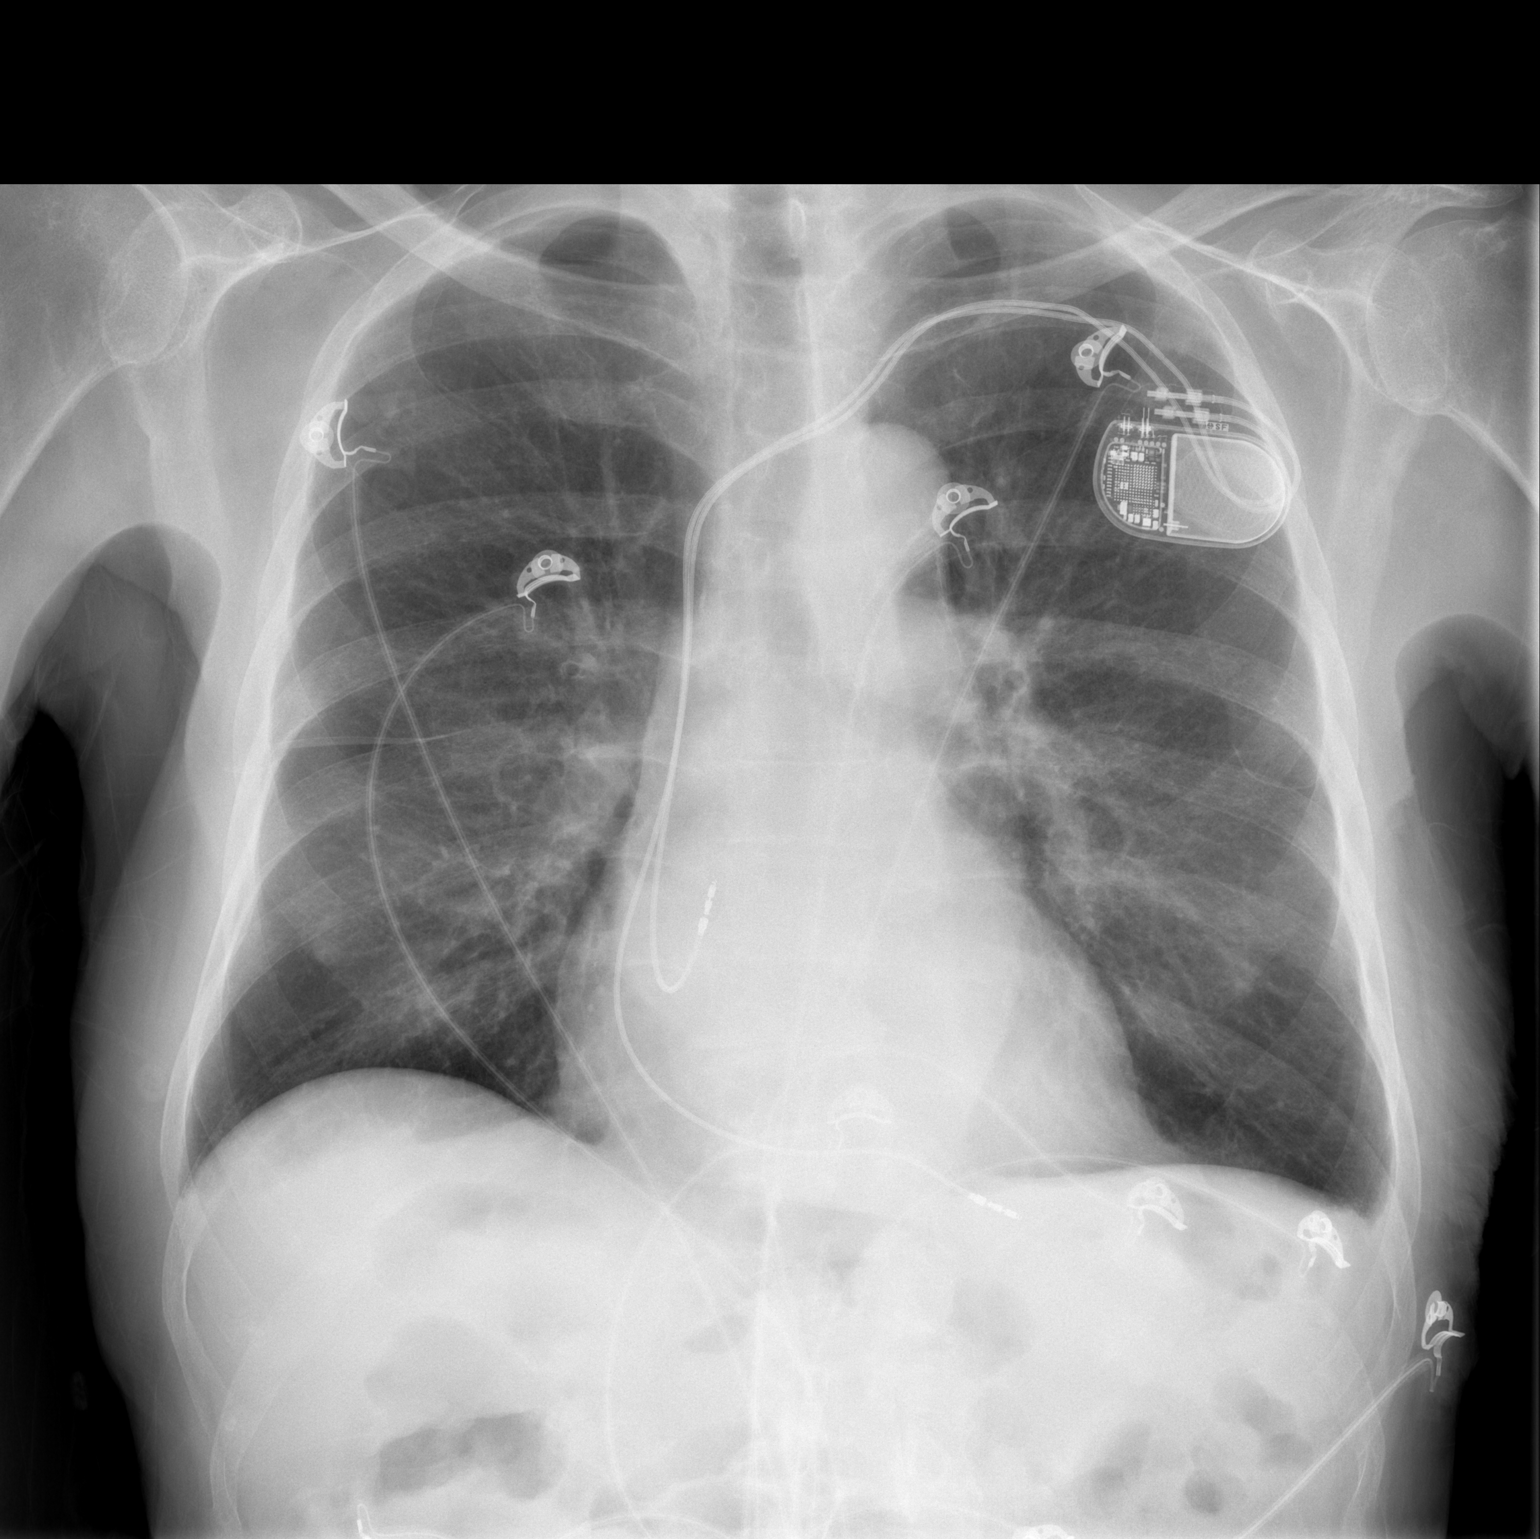

[w chest lat]
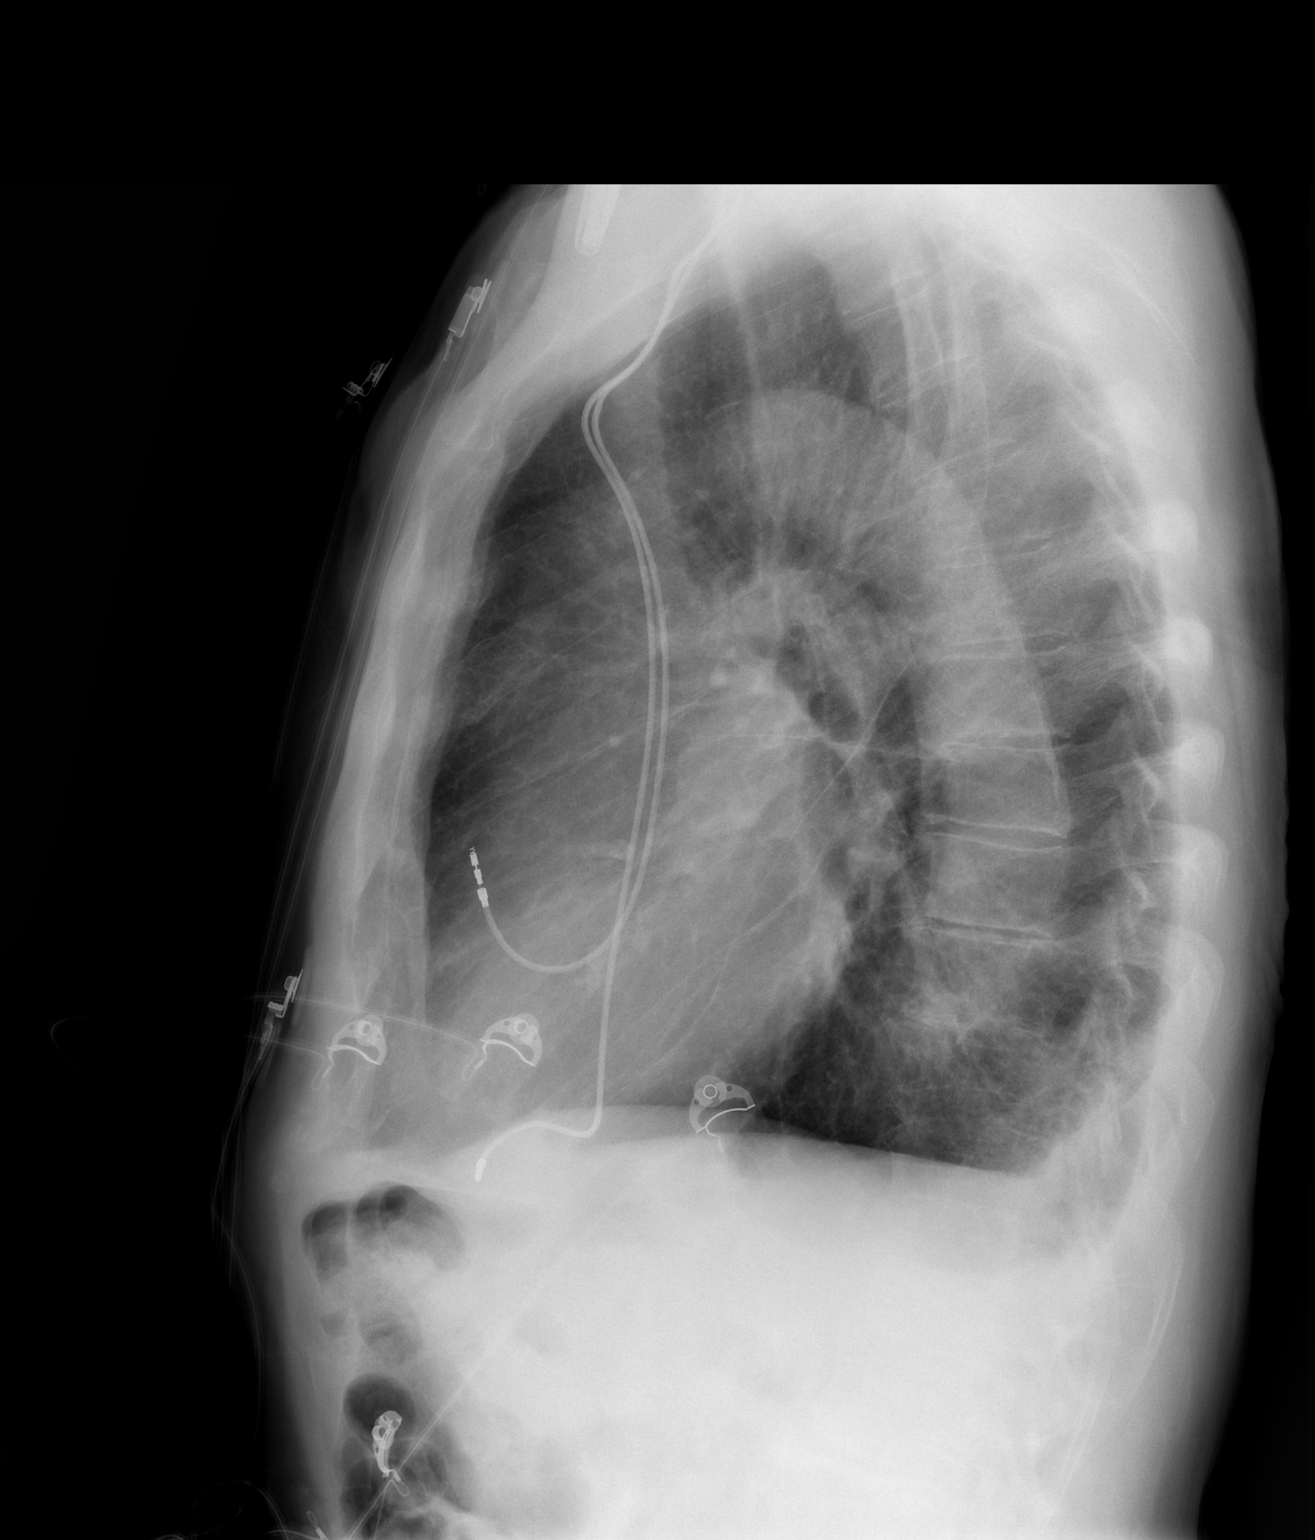

[2 of 2 positions shown; findings below may reference images not displayed]

FINDINGS: The lungs are well-expanded with mild hemidiaphragm flattening.
There are small bilateral pleural effusions blunting the
costophrenic angles. The heart is normal in size. The pulmonary
vascularity is not engorged. The ICD is in stable position. There is
mild multilevel degenerative disc disease of the thoracic spine.
IMPRESSION: Small bilateral pleural effusions blunt the costophrenic angles.
These are of uncertain etiology and duration. No evidence of
pneumonia.

No acute or chronic changes of CHF are observed.
